# Patient Record
Sex: Female | Born: 2000 | Race: Black or African American | Hispanic: No | Marital: Single | Smoking: Never smoker
Health system: Southern US, Community
[De-identification: ages and names within clinical notes are randomized; demographics above are authoritative.]

## PROBLEM LIST (undated history)

## (undated) DIAGNOSIS — B009 Herpesviral infection, unspecified: Secondary | ICD-10-CM

## (undated) HISTORY — DX: Herpesviral infection, unspecified: B00.9

---
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
## 2019-01-11 ENCOUNTER — Other Ambulatory Visit: Payer: Self-pay

## 2019-01-11 ENCOUNTER — Ambulatory Visit: Payer: Medicaid Other | Attending: Internal Medicine

## 2019-01-11 DIAGNOSIS — Z20822 Contact with and (suspected) exposure to covid-19: Secondary | ICD-10-CM

## 2019-01-12 LAB — NOVEL CORONAVIRUS, NAA: SARS-CoV-2, NAA: DETECTED — AB

## 2019-01-13 ENCOUNTER — Telehealth: Payer: Self-pay | Admitting: Physician Assistant

## 2019-01-13 NOTE — Telephone Encounter (Signed)
Patient was contacted regarding positive covid 19 result. Pt's symptoms include asomnia and hypogeusia, sore throat. No shortness of breath. We went over CDC guidelines for quarantine and ER precautions.    Oria Klimas PA-C  [facility]   

## 2019-03-08 DIAGNOSIS — T23271A Burn of second degree of right wrist, initial encounter: Secondary | ICD-10-CM | POA: Diagnosis not present

## 2019-03-11 DIAGNOSIS — Z3202 Encounter for pregnancy test, result negative: Secondary | ICD-10-CM | POA: Diagnosis not present

## 2019-03-11 DIAGNOSIS — N760 Acute vaginitis: Secondary | ICD-10-CM | POA: Diagnosis not present

## 2019-05-27 DIAGNOSIS — T24132A Burn of first degree of left lower leg, initial encounter: Secondary | ICD-10-CM | POA: Diagnosis not present

## 2019-05-27 DIAGNOSIS — T24131A Burn of first degree of right lower leg, initial encounter: Secondary | ICD-10-CM | POA: Diagnosis not present

## 2019-08-18 DIAGNOSIS — N926 Irregular menstruation, unspecified: Secondary | ICD-10-CM | POA: Diagnosis not present

## 2019-08-25 ENCOUNTER — Ambulatory Visit: Payer: Medicaid Other | Admitting: Nurse Practitioner

## 2019-10-19 ENCOUNTER — Other Ambulatory Visit: Payer: Self-pay | Admitting: *Deleted

## 2019-10-19 DIAGNOSIS — Z3041 Encounter for surveillance of contraceptive pills: Secondary | ICD-10-CM

## 2019-10-20 ENCOUNTER — Other Ambulatory Visit: Payer: Self-pay | Admitting: *Deleted

## 2019-10-20 DIAGNOSIS — Z3041 Encounter for surveillance of contraceptive pills: Secondary | ICD-10-CM

## 2019-10-20 MED ORDER — NORETHIN ACE-ETH ESTRAD-FE 1-20 MG-MCG PO TABS: 1.0000 | ORAL_TABLET | Freq: Every day | ORAL | 0 refills | Status: DC

## 2019-10-21 ENCOUNTER — Other Ambulatory Visit: Payer: Self-pay | Admitting: Family Medicine

## 2019-10-21 DIAGNOSIS — Z3041 Encounter for surveillance of contraceptive pills: Secondary | ICD-10-CM

## 2019-10-23 MED ORDER — NORETHIN ACE-ETH ESTRAD-FE 1-20 MG-MCG PO TABS: 1.0000 | ORAL_TABLET | Freq: Every day | ORAL | 0 refills | Status: DC

## 2019-10-23 NOTE — Telephone Encounter (Signed)
E-prescribe down. resent 

## 2019-10-23 NOTE — Addendum Note (Signed)
Addended by: Julious Payer D on: 10/23/2019 10:40 AM   Modules accepted: Orders

## 2019-11-02 ENCOUNTER — Ambulatory Visit: Payer: Medicaid Other | Admitting: Nurse Practitioner

## 2019-11-20 ENCOUNTER — Ambulatory Visit (INDEPENDENT_AMBULATORY_CARE_PROVIDER_SITE_OTHER): Payer: Medicaid Other | Admitting: Nurse Practitioner

## 2019-11-20 ENCOUNTER — Encounter: Payer: Self-pay | Admitting: Nurse Practitioner

## 2019-11-20 DIAGNOSIS — Z3041 Encounter for surveillance of contraceptive pills: Secondary | ICD-10-CM | POA: Insufficient documentation

## 2019-11-20 MED ORDER — NORETHIN ACE-ETH ESTRAD-FE 1-20 MG-MCG PO TABS: 1.0000 | ORAL_TABLET | Freq: Every day | ORAL | 6 refills | Status: DC

## 2019-11-20 NOTE — Assessment & Plan Note (Signed)
LMP-11/18/2019-currently on menstrual cycle. No adverse effect on current medication.  Refill sent to pharmacy.

## 2019-11-20 NOTE — Progress Notes (Signed)
   Virtual Visit via telephone Note Due to COVID-19 pandemic this visit was conducted virtually. This visit type was conducted due to national recommendations for restrictions regarding the COVID-19 Pandemic (e.g. social distancing, sheltering in place) in an effort to limit this patient's exposure and mitigate transmission in our community. All issues noted in this document were discussed and addressed.  A physical exam was not performed with this format.  I connected with Heather Barajas on 11/20/19 at 02:40 pm by telephone and verified that I am speaking with the correct person using two identifiers. Heather Barajas is currently located in her car and a friend is currently with patient during visit. The provider, Daryll Drown, NP is located in their office at time of visit.  I discussed the limitations, risks, security and privacy concerns of performing an evaluation and management service by telephone and the availability of in person appointments. I also discussed with the patient that there may be a patient responsible charge related to this service. The patient expressed understanding and agreed to proceed.   History and Present Illness:  HPI  Patient's televisit encounter for contraceptive pills.  Patient is compliant with current medication, no adverse side effects, patient follows up as directed.  Last menstrual.  11/18/2019 and patient is currently on menstrual cycle.  Review of Systems  Constitutional: Negative for chills and malaise/fatigue.  Neurological: Negative for dizziness and headaches.  Psychiatric/Behavioral: The patient is not nervous/anxious.   All other systems reviewed and are negative.    Observations/Objective:  Tele Visit  Assessment and Plan:  Encounter for surveillance of contraceptive pills LMP-11/18/2019-currently on menstrual cycle. No adverse effect on current medication.  Refill sent to pharmacy.    Follow Up Instructions:  Follow-up in 6  months   I discussed the assessment and treatment plan with the patient. The patient was provided an opportunity to ask questions and all were answered. The patient agreed with the plan and demonstrated an understanding of the instructions.   The patient was advised to call back or seek an in-person evaluation if the symptoms worsen or if the condition fails to improve as anticipated.  The above assessment and management plan was discussed with the patient. The patient verbalized understanding of and has agreed to the management plan. Patient is aware to call the clinic if symptoms persist or worsen. Patient is aware when to return to the clinic for a follow-up visit. Patient educated on when it is appropriate to go to the emergency department.   Time call ended:  02:50 pm  I provided 10 minutes of non-face-to-face time during this encounter.    Daryll Drown, NP

## 2019-12-08 ENCOUNTER — Telehealth: Payer: Medicaid Other | Admitting: Nurse Practitioner

## 2019-12-08 DIAGNOSIS — J00 Acute nasopharyngitis [common cold]: Secondary | ICD-10-CM | POA: Diagnosis not present

## 2019-12-08 MED ORDER — FLUTICASONE PROPIONATE 50 MCG/ACT NA SUSP: 2.0000 | Freq: Every day | NASAL | 6 refills | Status: DC

## 2019-12-08 MED ORDER — BENZONATATE 100 MG PO CAPS: 100.0000 mg | ORAL_CAPSULE | Freq: Three times a day (TID) | ORAL | 0 refills | Status: DC | PRN

## 2019-12-08 NOTE — Progress Notes (Signed)

## 2020-02-07 DIAGNOSIS — Z20822 Contact with and (suspected) exposure to covid-19: Secondary | ICD-10-CM | POA: Diagnosis not present

## 2020-02-15 DIAGNOSIS — Z20822 Contact with and (suspected) exposure to covid-19: Secondary | ICD-10-CM | POA: Diagnosis not present

## 2020-02-15 DIAGNOSIS — Z03818 Encounter for observation for suspected exposure to other biological agents ruled out: Secondary | ICD-10-CM | POA: Diagnosis not present

## 2020-03-07 ENCOUNTER — Encounter: Payer: Self-pay | Admitting: Family Medicine

## 2020-03-07 ENCOUNTER — Other Ambulatory Visit: Payer: Self-pay

## 2020-03-07 ENCOUNTER — Other Ambulatory Visit (HOSPITAL_COMMUNITY)
Admission: RE | Admit: 2020-03-07 | Discharge: 2020-03-07 | Disposition: A | Discharge status: Home / Self Care | Payer: Medicaid Other | Source: Ambulatory Visit | Attending: Family Medicine | Admitting: Family Medicine

## 2020-03-07 ENCOUNTER — Ambulatory Visit (INDEPENDENT_AMBULATORY_CARE_PROVIDER_SITE_OTHER): Payer: Medicaid Other | Admitting: Family Medicine

## 2020-03-07 VITALS — BP 110/62 | HR 62 | Temp 97.8°F | Ht <= 58 in | Wt 140.6 lb

## 2020-03-07 DIAGNOSIS — N898 Other specified noninflammatory disorders of vagina: Secondary | ICD-10-CM | POA: Insufficient documentation

## 2020-03-07 LAB — WET PREP FOR TRICH, YEAST, CLUE
Clue Cell Exam: NEGATIVE
Trichomonas Exam: NEGATIVE
Yeast Exam: NEGATIVE

## 2020-03-07 NOTE — Progress Notes (Unsigned)
Subjective: CC: vaginal discharge PCP: Daryll Drown, NP Heather Barajas is a 20 y.o. female presenting to clinic today for:  1. Vaginal Discharge  Patient reports that discharge started a few days prior to her menstrual cycle, which is now on.  She notes that discharge appears yellow and has a body odor.  She thought it was thick and even cottage cheeselike prior to cycle.   She denies vaginal pruritis, abnormal vaginal bleeding, dysuria, hematuria, pelvic pain, nausea, vomiting, fevers, dyspareunia, post coital bleeding, sore throat, rashes.   She has used nothing.   She reports a history of chlamydia and bacterial vaginosis in the past.  She is sexually active and in a monogamous relationship.  Patient's last menstrual period was 03/05/2020.     ROS: Per HPI  No Known Allergies History reviewed. No pertinent past medical history.  Current Outpatient Medications:  .  fluticasone (FLONASE) 50 MCG/ACT nasal spray, Place 2 sprays into both nostrils daily., Disp: 16 g, Rfl: 6 .  norethindrone-ethinyl estradiol (JUNEL FE 1/20) 1-20 MG-MCG tablet, Take 1 tablet by mouth daily. (Needs to be seen before next refill), Disp: 28 tablet, Rfl: 6 .  Probiotic Product (PROBIOTIC DAILY) CAPS, Take 1 capsule by mouth daily., Disp: 90 capsule, Rfl: 1 Social History   Socioeconomic History  . Marital status: Single    Spouse name: Not on file  . Number of children: Not on file  . Years of education: Not on file  . Highest education level: Not on file  Occupational History  . Not on file  Tobacco Use  . Smoking status: Never Smoker  . Smokeless tobacco: Never Used  Vaping Use  . Vaping Use: Never used  Substance and Sexual Activity  . Alcohol use: No    Alcohol/week: 0.0 standard drinks  . Drug use: No  . Sexual activity: Not on file  Other Topics Concern  . Not on file  Social History Narrative  . Not on file   Social Determinants of Health   Financial Resource Strain: Not  on file  Food Insecurity: Not on file  Transportation Needs: Not on file  Physical Activity: Not on file  Stress: Not on file  Social Connections: Not on file  Intimate Partner Violence: Not on file   History reviewed. No pertinent family history.  Objective: Office vital signs reviewed. BP 110/62   Pulse 62   Temp 97.8 F (36.6 C) (Temporal)   Ht 4\' 10"  (1.473 m)   Wt 140 lb 9.6 oz (63.8 kg)   SpO2 100%   PF 62 L/min   BMI 29.39 kg/m   Physical Examination:  General: Awake, alert, well nourished, No acute distress GU: external vaginal tissue normal, cervix midline, no punctate lesions on cervix appreciated, moderate bloody discharge from cervical os, no cervical motion tenderness, no abdominal/ adnexal masses  Assessment/ Plan: 20 y.o. female   Vaginal discharge - Plan: GC/Chlamydia probe amp ([place])not at [facility], WET PREP FOR TRICH, YEAST, CLUE  Wet prep without evidence of bacterial vaginosis or yeast.  GC/ CT order placed.  I suspect that the changes she saw are likely premenstrual in nature but will await these other STD labs and contact her at [phone] once the rest the results are available  No orders of the defined types were placed in this encounter.  No orders of the defined types were placed in this encounter.    04-13-1975, DO [facility] [phone]

## 2020-03-07 NOTE — Patient Instructions (Signed)

## 2020-03-11 LAB — GC/CHLAMYDIA PROBE AMP (~~LOC~~) NOT AT ARMC
Chlamydia: NEGATIVE
Comment: NEGATIVE
Comment: NORMAL
Neisseria Gonorrhea: NEGATIVE

## 2020-05-21 ENCOUNTER — Other Ambulatory Visit: Payer: Self-pay

## 2020-05-21 ENCOUNTER — Ambulatory Visit (INDEPENDENT_AMBULATORY_CARE_PROVIDER_SITE_OTHER): Payer: Medicaid Other | Admitting: Nurse Practitioner

## 2020-05-21 ENCOUNTER — Encounter: Payer: Self-pay | Admitting: Nurse Practitioner

## 2020-05-21 VITALS — BP 119/74 | HR 72 | Temp 97.9°F | Ht <= 58 in | Wt 139.0 lb

## 2020-05-21 DIAGNOSIS — Z23 Encounter for immunization: Secondary | ICD-10-CM

## 2020-05-21 DIAGNOSIS — Z3041 Encounter for surveillance of contraceptive pills: Secondary | ICD-10-CM

## 2020-05-21 MED ORDER — NORETHIN ACE-ETH ESTRAD-FE 1-20 MG-MCG PO TABS: 1.0000 | ORAL_TABLET | Freq: Every day | ORAL | 6 refills | Status: DC

## 2020-05-21 NOTE — Assessment & Plan Note (Signed)
Refill oral birth control.  Education provided to patient with printed handouts given.  HPV third dose shot given.  Patient tolerated well  Hyperextended pharmacy Follow-up in 6 months.

## 2020-05-21 NOTE — Progress Notes (Signed)
Established Patient Office Visit  Subjective:  Patient ID: Heather Barajas, female    DOB: [birth year]  Age: 20 y.o. MRN: [id number]  CC:  Chief Complaint  Patient presents with  . Contraception    HPI Heather Barajas presents for female contraception.  No new concerns.  Patient compliant with current medication in is not experiencing any side effects.  Last menstrual period 05/20/2020.    Social History   Socioeconomic History  . Marital status: Single    Spouse name: Not on file  . Number of children: Not on file  . Years of education: Not on file  . Highest education level: Not on file  Occupational History  . Not on file  Tobacco Use  . Smoking status: Never Smoker  . Smokeless tobacco: Never Used  Vaping Use  . Vaping Use: Never used  Substance and Sexual Activity  . Alcohol use: No    Alcohol/week: 0.0 standard drinks  . Drug use: No  . Sexual activity: Yes    Birth control/protection: Pill  Other Topics Concern  . Not on file  Social History Narrative  . Not on file   Social Determinants of Health   Financial Resource Strain: Not on file  Food Insecurity: Not on file  Transportation Needs: Not on file  Physical Activity: Not on file  Stress: Not on file  Social Connections: Not on file  Intimate Partner Violence: Not on file    Outpatient Medications Prior to Visit  Medication Sig Dispense Refill  . fluticasone (FLONASE) 50 MCG/ACT nasal spray Place 2 sprays into both nostrils daily. 16 g 6  . norethindrone-ethinyl estradiol (JUNEL FE 1/20) 1-20 MG-MCG tablet Take 1 tablet by mouth daily. (Needs to be seen before next refill) 28 tablet 6   No facility-administered medications prior to visit.    No Known Allergies  ROS Review of Systems  Constitutional: Negative.   HENT: Negative.   Respiratory: Negative.   Cardiovascular: Negative.   Gastrointestinal: Negative.   Genitourinary: Negative.   Musculoskeletal: Negative.   Skin: Negative.   Negative for rash.  All other systems reviewed and are negative.     Objective:    Physical Exam Vitals reviewed.  Constitutional:      Appearance: Normal appearance.  HENT:     Head: Normocephalic.     Nose: Nose normal.  Eyes:     Conjunctiva/sclera: Conjunctivae normal.  Cardiovascular:     Rate and Rhythm: Normal rate and regular rhythm.     Pulses: Normal pulses.     Heart sounds: Normal heart sounds.  Pulmonary:     Effort: Pulmonary effort is normal.     Breath sounds: Normal breath sounds.  Abdominal:     General: Bowel sounds are normal.  Musculoskeletal:        General: Normal range of motion.  Skin:    Findings: No rash.  Neurological:     Mental Status: She is alert and oriented to person, place, and time.  Psychiatric:        Mood and Affect: Mood normal.        Behavior: Behavior normal.     BP 119/74   Pulse 72   Temp 97.9 F (36.6 C) (Temporal)   Ht 4\' 10"  (1.473 m)   Wt 139 lb (63 kg)   LMP 04/28/2020   SpO2 99%   BMI 29.05 kg/m  Wt Readings from Last 3 Encounters:  05/21/20 139 lb (63 kg)  03/07/20  140 lb 9.6 oz (63.8 kg)  07/28/18 131 lb (59.4 kg) (59 %, Z= 0.23)*   * Growth percentiles are based on CDC (Girls, 2-20 Years) data.     Health Maintenance Due  Topic Date Due  . COVID-19 Vaccine (3 - Booster for Pfizer series) 09/19/2019    There are no preventive care reminders to display for this patient.  No results found for: TSH Lab Results  Component Value Date   WBC 7.0 10/29/2016   HGB 11.8 10/29/2016   HCT 35.6 10/29/2016   MCV 90 10/29/2016   PLT 358 10/29/2016      Assessment & Plan:   Problem List Items Addressed This Visit      Other   Encounter for surveillance of contraceptive pills - Primary    Refill oral birth control.  Education provided to patient with printed handouts given.  HPV third dose shot given.  Patient tolerated well  Hyperextended pharmacy Follow-up in 6 months.      Relevant Medications    norethindrone-ethinyl estradiol-FE (JUNEL FE 1/20) 1-20 MG-MCG tablet      Meds ordered this encounter  Medications  . norethindrone-ethinyl estradiol-FE (JUNEL FE 1/20) 1-20 MG-MCG tablet    Sig: Take 1 tablet by mouth daily. (Needs to be seen before next refill)    Dispense:  28 tablet    Refill:  6    Order Specific Question:   Supervising Provider    Answer:   Raliegh Ip [[id number]]    Follow-up: Return in about 6 months (around 11/21/2020).    Daryll Drown, NP

## 2020-05-21 NOTE — Patient Instructions (Signed)

## 2020-08-08 ENCOUNTER — Telehealth: Payer: Medicaid Other | Admitting: Physician Assistant

## 2020-08-08 DIAGNOSIS — R350 Frequency of micturition: Secondary | ICD-10-CM

## 2020-08-08 DIAGNOSIS — N760 Acute vaginitis: Secondary | ICD-10-CM | POA: Diagnosis not present

## 2020-08-08 MED ORDER — FLUCONAZOLE 150 MG PO TABS: 150.0000 mg | ORAL_TABLET | Freq: Once | ORAL | 0 refills | Status: AC

## 2020-08-08 MED ORDER — CEPHALEXIN 500 MG PO CAPS: 500.0000 mg | ORAL_CAPSULE | Freq: Four times a day (QID) | ORAL | 0 refills | Status: AC

## 2020-08-08 NOTE — Progress Notes (Signed)
E-Visit for Vaginal Symptoms  We are sorry that you are not feeling well. Here is how we plan to help! Based on what you shared with me it looks like you: May have a yeast vaginosis  Vaginosis is an inflammation of the vagina that can result in discharge, itching and pain. The cause is usually a change in the normal balance of vaginal bacteria or an infection. Vaginosis can also result from reduced estrogen levels after menopause.  The most common causes of vaginosis are:   Bacterial vaginosis which results from an overgrowth of one on several organisms that are normally present in your vagina.   Yeast infections which are caused by a naturally occurring fungus called candida.   Vaginal atrophy (atrophic vaginosis) which results from the thinning of the vagina from reduced estrogen levels after menopause.   Trichomoniasis which is caused by a parasite and is commonly transmitted by sexual intercourse.  Factors that increase your risk of developing vaginosis include: Medications, such as antibiotics and steroids Uncontrolled diabetes Use of hygiene products such as bubble bath, vaginal spray or vaginal deodorant Douching Wearing damp or tight-fitting clothing Using an intrauterine device (IUD) for birth control Hormonal changes, such as those associated with pregnancy, birth control pills or menopause Sexual activity Having a sexually transmitted infection  Your treatment plan is A single Diflucan (fluconazole) 150mg  tablet once.  I have electronically sent this prescription into the pharmacy that you have chosen.  I have also prescribed Keflex to treat a potentially urinary tract infection.  Please note that it is not normal to have painful intercourse with a urinary trace infection or a yeast infection. If your symptoms persist despite taking the prescriptions I am giving you, then you need to be seen in person for a pelvic exam and STD testing.   Be sure to take all of the  medication as directed. Stop taking any medication if you develop a rash, tongue swelling or shortness of breath. Mothers who are breast feeding should consider pumping and discarding their breast milk while on these antibiotics. However, there is no consensus that infant exposure at these doses would be harmful.  Remember that medication creams can weaken latex condoms.   HOME CARE:  Good hygiene may prevent some types of vaginosis from recurring and may relieve some symptoms:  Avoid baths, hot tubs and whirlpool spas. Rinse soap from your outer genital area after a shower, and dry the area well to prevent irritation. Don't use scented or harsh soaps, such as those with deodorant or antibacterial action. Avoid irritants. These include scented tampons and pads. Wipe from front to back after using the toilet. Doing so avoids spreading fecal bacteria to your vagina.  Other things that may help prevent vaginosis include:  Don't douche. Your vagina doesn't require cleansing other than normal bathing. Repetitive douching disrupts the normal organisms that reside in the vagina and can actually increase your risk of vaginal infection. Douching won't clear up a vaginal infection. Use a latex condom. Both female and female latex condoms may help you avoid infections spread by sexual contact. Wear cotton underwear. Also wear pantyhose with a cotton crotch. If you feel comfortable without it, skip wearing underwear to bed. Yeast thrives in [place] Your symptoms should improve in the next day or two.  GET HELP RIGHT AWAY IF:  You have pain in your lower abdomen ( pelvic area or over your ovaries) You develop nausea or vomiting You develop a fever Your discharge  changes or worsens You have persistent pain with intercourse You develop shortness of breath, a rapid pulse, or you faint.  These symptoms could be signs of problems or infections that need to be evaluated by a medical provider  now.  MAKE SURE YOU   Understand these instructions. Will watch your condition. Will get help right away if you are not doing well or get worse.  Thank you for choosing an e-visit.  Your e-visit answers were reviewed by a board certified advanced clinical practitioner to complete your personal care plan. Depending upon the condition, your plan could have included both over the counter or prescription medications.  Please review your pharmacy choice. Make sure the pharmacy is open so you can pick up prescription now. If there is a problem, you may contact your provider through [employer] and have the prescription routed to another pharmacy.  Your safety is important to [place]. If you have drug allergies check your prescription carefully.   For the next 24 hours you can use MyChart to ask questions about today's visit, request a non-urgent call back, or ask for a work or school excuse. You will get an email in the next two days asking about your experience. I hope that your e-visit has been valuable and will speed your recovery.  Approximately 5 minutes was spent documenting and reviewing patient's chart.

## 2020-11-27 ENCOUNTER — Other Ambulatory Visit: Payer: Self-pay | Admitting: Nurse Practitioner

## 2020-11-27 DIAGNOSIS — Z3041 Encounter for surveillance of contraceptive pills: Secondary | ICD-10-CM

## 2020-12-01 DIAGNOSIS — Z20822 Contact with and (suspected) exposure to covid-19: Secondary | ICD-10-CM | POA: Diagnosis not present

## 2020-12-20 DIAGNOSIS — N841 Polyp of cervix uteri: Secondary | ICD-10-CM | POA: Diagnosis not present

## 2020-12-20 DIAGNOSIS — N898 Other specified noninflammatory disorders of vagina: Secondary | ICD-10-CM | POA: Diagnosis not present

## 2020-12-20 DIAGNOSIS — N760 Acute vaginitis: Secondary | ICD-10-CM | POA: Diagnosis not present

## 2020-12-20 DIAGNOSIS — N912 Amenorrhea, unspecified: Secondary | ICD-10-CM | POA: Diagnosis not present

## 2020-12-20 DIAGNOSIS — Z3202 Encounter for pregnancy test, result negative: Secondary | ICD-10-CM | POA: Diagnosis not present

## 2021-06-15 ENCOUNTER — Other Ambulatory Visit: Payer: Self-pay | Admitting: Nurse Practitioner

## 2021-06-15 DIAGNOSIS — Z3041 Encounter for surveillance of contraceptive pills: Secondary | ICD-10-CM

## 2021-06-17 ENCOUNTER — Telehealth (INDEPENDENT_AMBULATORY_CARE_PROVIDER_SITE_OTHER): Payer: Medicaid Other | Admitting: Nurse Practitioner

## 2021-06-17 ENCOUNTER — Encounter: Payer: Self-pay | Admitting: Nurse Practitioner

## 2021-06-17 DIAGNOSIS — Z3041 Encounter for surveillance of contraceptive pills: Secondary | ICD-10-CM

## 2021-06-17 NOTE — Progress Notes (Signed)
   Virtual Visit  Note Due to COVID-19 pandemic this visit was conducted virtually. This visit type was conducted due to national recommendations for restrictions regarding the COVID-19 Pandemic (e.g. social distancing, sheltering in place) in an effort to limit this patient's exposure and mitigate transmission in our community. All issues noted in this document were discussed and addressed.  A physical exam was not performed with this format.  I connected with Heather Barajas on 06/17/21 at 12;40 pm by telephone and verified that I am speaking with the correct person using two identifiers. Heather Barajas is currently located at work  during visit. The provider, Daryll Drown, NP is located in their office at time of visit.  I discussed the limitations, risks, security and privacy concerns of performing an evaluation and management service by telephone and the availability of in person appointments. I also discussed with the patient that there may be a patient responsible charge related to this service. The patient expressed understanding and agreed to proceed.   History and Present Illness:   Patient is being seen today for refill contraceptive pill.  Last menstrual period: Currently on her period, no new concerns, patient has no adverse reaction or side effect from medication.  She is compliant with follow-up as directed   Review of Systems  Constitutional: Negative.   HENT: Negative.    Eyes: Negative.   Respiratory: Negative.    Cardiovascular: Negative.   Genitourinary: Negative.   Musculoskeletal: Negative.   Skin: Negative.   All other systems reviewed and are negative.    Observations/Objective: Televisit patient not in distress  Assessment and Plan: Completed urine pregnancy test results pending.  Will refill birth control pills with negative pregnancy test.  Follow Up Instructions: Follow-up as needed    I discussed the assessment and treatment plan with the  patient. The patient was provided an opportunity to ask questions and all were answered. The patient agreed with the plan and demonstrated an understanding of the instructions.   The patient was advised to call back or seek an in-person evaluation if the symptoms worsen or if the condition fails to improve as anticipated.  The above assessment and management plan was discussed with the patient. The patient verbalized understanding of and has agreed to the management plan. Patient is aware to call the clinic if symptoms persist or worsen. Patient is aware when to return to the clinic for a follow-up visit. Patient educated on when it is appropriate to go to the emergency department.   Time call ended: 12:50 PM  I provided 10 minutes of  non face-to-face time during this encounter.    Daryll Drown, NP

## 2021-06-18 ENCOUNTER — Other Ambulatory Visit: Payer: Medicaid Other

## 2021-06-18 ENCOUNTER — Other Ambulatory Visit: Payer: Self-pay

## 2021-06-18 DIAGNOSIS — Z3041 Encounter for surveillance of contraceptive pills: Secondary | ICD-10-CM

## 2021-06-18 LAB — PREGNANCY, URINE: Preg Test, Ur: NEGATIVE

## 2021-06-19 ENCOUNTER — Other Ambulatory Visit: Payer: Self-pay | Admitting: Nurse Practitioner

## 2021-06-19 DIAGNOSIS — Z3041 Encounter for surveillance of contraceptive pills: Secondary | ICD-10-CM

## 2021-06-20 ENCOUNTER — Telehealth: Payer: Self-pay | Admitting: Nurse Practitioner

## 2021-06-20 DIAGNOSIS — Z3041 Encounter for surveillance of contraceptive pills: Secondary | ICD-10-CM

## 2021-06-20 MED ORDER — NORETHIN ACE-ETH ESTRAD-FE 1-20 MG-MCG PO TABS: 1.0000 | ORAL_TABLET | Freq: Every day | ORAL | 6 refills | Status: DC

## 2021-06-20 NOTE — Telephone Encounter (Signed)
Prescription sent to pharmacy.   Patient had video visit on 06/18/21

## 2021-09-18 ENCOUNTER — Encounter: Payer: Self-pay | Admitting: Family

## 2021-09-18 ENCOUNTER — Telehealth: Payer: Medicaid Other | Admitting: Family

## 2021-09-18 DIAGNOSIS — Z20828 Contact with and (suspected) exposure to other viral communicable diseases: Secondary | ICD-10-CM

## 2021-09-18 DIAGNOSIS — R6889 Other general symptoms and signs: Secondary | ICD-10-CM | POA: Diagnosis not present

## 2021-09-18 MED ORDER — OSELTAMIVIR PHOSPHATE 75 MG PO CAPS: 75.0000 mg | ORAL_CAPSULE | Freq: Two times a day (BID) | ORAL | 0 refills | Status: DC

## 2021-09-18 NOTE — Progress Notes (Signed)
Virtual Visit Consent   Heather Barajas, you are scheduled for a virtual visit with a [facility] provider today. Just as with appointments in the office, your consent must be obtained to participate. Your consent will be active for this visit and any virtual visit you may have with one of our providers in the next 365 days. If you have a MyChart account, a copy of this consent can be sent to you electronically.  As this is a virtual visit, video technology does not allow for your provider to perform a traditional examination. This may limit your provider's ability to fully assess your condition. If your provider identifies any concerns that need to be evaluated in person or the need to arrange testing (such as labs, EKG, etc.), we will make arrangements to do so. Although advances in technology are sophisticated, we cannot ensure that it will always work on either your end or our end. If the connection with a video visit is poor, the visit may have to be switched to a telephone visit. With either a video or telephone visit, we are not always able to ensure that we have a secure connection.  By engaging in this virtual visit, you consent to the provision of healthcare and authorize for your insurance to be billed (if applicable) for the services provided during this visit. Depending on your insurance coverage, you may receive a charge related to this service.  I need to obtain your verbal consent now. Are you willing to proceed with your visit today? [name] has provided verbal consent on 09/18/2021 for a virtual visit (video or telephone). [name], FNP  Date: 09/18/2021 12:47 PM  Virtual Visit via Video Note   I, [name], connected with  Heather Barajas  ([id number], [birth year]) on 09/18/21 at  4:30 PM EDT by a video-enabled telemedicine application and verified that I am speaking with the correct person using two identifiers.  Location: Patient: Virtual Visit Location Patient:  Home Provider: Virtual Visit Location Provider: Office/Clinic   I discussed the limitations of evaluation and management by telemedicine and the availability of in person appointments. The patient expressed understanding and agreed to proceed.    History of Present Illness: Heather Barajas is a 21 y.o. who identifies as a female who was assigned female at birth, and is being seen today for flu like symptoms that started yesterday. She reports congestion, body aches,  headache, and fever. Reports she was exposed to the Flu two days ago.   HPI: Influenza This is a new problem. The current episode started yesterday. The problem has been unchanged. Associated symptoms include chills, congestion, coughing, fatigue, a fever, headaches and myalgias. Pertinent negatives include no vomiting or weakness. She has tried NSAIDs and acetaminophen for the symptoms. The treatment provided mild relief.    Problems:  Patient Active Problem List   Diagnosis Date Noted   Encounter for surveillance of contraceptive pills 11/20/2019    Allergies: No Known Allergies Medications:  Current Outpatient Medications:    oseltamivir (TAMIFLU) 75 MG capsule, Take 1 capsule (75 mg total) by mouth 2 (two) times daily., Disp: 10 capsule, Rfl: 0   norethindrone-ethinyl estradiol-FE (JUNEL FE 1/20) 1-20 MG-MCG tablet, Take 1 tablet by mouth daily., Disp: 28 tablet, Rfl: 6  Observations/Objective: Patient is well-developed, well-nourished in no acute distress.  Resting comfortably  at home.  Head is normocephalic, atraumatic.  No labored breathing.  Speech is clear and coherent with logical content.  Patient is  alert and oriented at baseline.  Nasal congestion  Assessment and Plan: 1. Exposure to the flu - oseltamivir (TAMIFLU) 75 MG capsule; Take 1 capsule (75 mg total) by mouth 2 (two) times daily.  Dispense: 10 capsule; Refill: 0  2. Flu-like symptoms - oseltamivir (TAMIFLU) 75 MG capsule; Take 1 capsule (75 mg  total) by mouth 2 (two) times daily.  Dispense: 10 capsule; Refill: 0  Force fluids Tylenol as needed Droplet precautions  Follow up if symptoms worsen or do not improve   Follow Up Instructions: I discussed the assessment and treatment plan with the patient. The patient was provided an opportunity to ask questions and all were answered. The patient agreed with the plan and demonstrated an understanding of the instructions.  A copy of instructions were sent to the patient via MyChart unless otherwise noted below.    The patient was advised to call back or seek an in-person evaluation if the symptoms worsen or if the condition fails to improve as anticipated.  Time:  I spent 8 minutes with the patient via telehealth technology discussing the above problems/concerns.    [name], FNP

## 2021-10-27 DIAGNOSIS — N898 Other specified noninflammatory disorders of vagina: Secondary | ICD-10-CM | POA: Diagnosis not present

## 2021-10-27 DIAGNOSIS — B3731 Acute candidiasis of vulva and vagina: Secondary | ICD-10-CM | POA: Diagnosis not present

## 2021-12-05 ENCOUNTER — Other Ambulatory Visit: Payer: Self-pay | Admitting: Nurse Practitioner

## 2021-12-05 DIAGNOSIS — Z3041 Encounter for surveillance of contraceptive pills: Secondary | ICD-10-CM

## 2021-12-11 DIAGNOSIS — R0981 Nasal congestion: Secondary | ICD-10-CM | POA: Diagnosis not present

## 2021-12-11 DIAGNOSIS — J029 Acute pharyngitis, unspecified: Secondary | ICD-10-CM | POA: Diagnosis not present

## 2022-03-03 ENCOUNTER — Encounter: Payer: No Typology Code available for payment source | Admitting: Family Medicine

## 2022-03-03 ENCOUNTER — Ambulatory Visit (INDEPENDENT_AMBULATORY_CARE_PROVIDER_SITE_OTHER): Payer: No Typology Code available for payment source | Admitting: Family Medicine

## 2022-03-03 ENCOUNTER — Other Ambulatory Visit (HOSPITAL_COMMUNITY)
Admission: RE | Admit: 2022-03-03 | Discharge: 2022-03-03 | Disposition: A | Discharge status: Home / Self Care | Payer: Medicaid Other | Source: Ambulatory Visit | Attending: Family Medicine | Admitting: Family Medicine

## 2022-03-03 ENCOUNTER — Encounter: Payer: Self-pay | Admitting: Family Medicine

## 2022-03-03 VITALS — BP 114/72 | HR 79 | Temp 99.0°F | Ht <= 58 in | Wt 118.0 lb

## 2022-03-03 DIAGNOSIS — Z7251 High risk heterosexual behavior: Secondary | ICD-10-CM

## 2022-03-03 DIAGNOSIS — Z3041 Encounter for surveillance of contraceptive pills: Secondary | ICD-10-CM

## 2022-03-03 DIAGNOSIS — Z136 Encounter for screening for cardiovascular disorders: Secondary | ICD-10-CM

## 2022-03-03 DIAGNOSIS — Z124 Encounter for screening for malignant neoplasm of cervix: Secondary | ICD-10-CM | POA: Diagnosis not present

## 2022-03-03 DIAGNOSIS — Z0000 Encounter for general adult medical examination without abnormal findings: Secondary | ICD-10-CM

## 2022-03-03 DIAGNOSIS — Z23 Encounter for immunization: Secondary | ICD-10-CM

## 2022-03-03 DIAGNOSIS — Z793 Long term (current) use of hormonal contraceptives: Secondary | ICD-10-CM

## 2022-03-03 LAB — WET PREP FOR TRICH, YEAST, CLUE
Clue Cell Exam: NEGATIVE
Trichomonas Exam: NEGATIVE
Yeast Exam: NEGATIVE

## 2022-03-03 MED ORDER — NORETHIN ACE-ETH ESTRAD-FE 1-20 MG-MCG PO TABS: 1.0000 | ORAL_TABLET | Freq: Every day | ORAL | 3 refills | Status: DC

## 2022-03-03 NOTE — Addendum Note (Signed)
Addended by: Geryl Rankins D on: 03/03/2022 09:52 AM   Modules accepted: Orders

## 2022-03-03 NOTE — Progress Notes (Signed)
Complete physical exam  Patient: Heather Barajas   DOB: 2000/11/02   22 y.o. Female  MRN: [id number]   Heather Barajas is a 22 y.o. female presents to office today for annual physical exam examination.    Concerns today include: 1. None. States there has been a little discharge that is white but is normal and she is not concerned about it. Not like other discharge she has had in the past. Currently sexually active with one female partner.   Occupation: Education officer, museum, Marital status: single, Substance use: no, etoh socially  Diet: meat, avoids dairy, veggies and fruit, Exercise: not regularly  Last eye exam: 1.5 years ago  Last dental exam: [facility] in [place], sees regularly  Last colonoscopy: n/a  Last mammogram: n/a  Last pap smear: today!  Refills needed today: Junel BCP  Immunizations needed: TDAP  Immunization History  Administered Date(s) Administered   DTaP 12/23/1999, 02/02/2000, 03/24/2000, 01/25/2001, 10/22/2003   HPV 9-valent 06/23/2017, 07/21/2017, 05/21/2020   Hepatitis A, Ped/Adol-2 Dose 06/23/2017   Hepatitis B 07-23-1999, 10/30/1999, 03/24/2000, 10/22/2003   IPV 12/23/1999, 02/02/2000, 05/27/2000, 10/22/2003   Influenza,inj,Quad PF,6+ Mos 11/28/2014, 01/03/2016   MMR 01/25/2001, 10/22/2003   Meningococcal B, OMV 06/23/2017, 07/21/2017   Meningococcal Conjugate 06/23/2017   PFIZER(Purple Top)SARS-COV-2 Vaccination 03/29/2019, 04/19/2019   Tdap 07/30/2010   Varicella 01/25/2001     History reviewed. No pertinent past medical history. Social History   Socioeconomic History   Marital status: Single    Spouse name: Not on file   Number of children: Not on file   Years of education: Not on file   Highest education level: Not on file  Occupational History   Not on file  Tobacco Use   Smoking status: Never   Smokeless tobacco: Never  Vaping Use   Vaping Use: Never used  Substance and Sexual Activity   Alcohol use: No    Alcohol/week: 0.0 standard  drinks of alcohol   Drug use: No   Sexual activity: Yes    Birth control/protection: Pill  Other Topics Concern   Not on file  Social History Narrative   Not on file   Social Determinants of Health   Financial Resource Strain: Not on file  Food Insecurity: Not on file  Transportation Needs: Not on file  Physical Activity: Not on file  Stress: Not on file  Social Connections: Not on file  Intimate Partner Violence: Not on file   History reviewed. No pertinent surgical history. History reviewed. No pertinent family history.  Current Outpatient Medications:    norethindrone-ethinyl estradiol-FE (JUNEL FE 1/20) 1-20 MG-MCG tablet, TAKE 1 TABLET BY MOUTH EVERY DAY, Disp: 84 tablet, Rfl: 0  No Known Allergies   ROS: Review of Systems Pertinent items are noted in HPI.   All systems negative   Physical exam BP 114/72   Pulse 79   Temp 99 F (37.2 C)   Ht '4\' 10"'$  (1.473 m)   Wt 118 lb (53.5 kg)   LMP 02/10/2022 (Approximate)   SpO2 98%   BMI 24.66 kg/m   Physical Exam Exam conducted with a chaperone present.  Constitutional:      General: She is not in acute distress.    Appearance: Normal appearance. She is normal weight. She is not ill-appearing, toxic-appearing or diaphoretic.  HENT:     Head: Atraumatic.     Right Ear: Tympanic membrane, ear canal and external ear normal.     Left Ear: Tympanic membrane, ear canal  and external ear normal.     Nose: Nose normal. No congestion or rhinorrhea.     Mouth/Throat:     Mouth: Mucous membranes are moist.     Pharynx: Oropharynx is clear. No oropharyngeal exudate or posterior oropharyngeal erythema.  Eyes:     Extraocular Movements: Extraocular movements intact.     Conjunctiva/sclera: Conjunctivae normal.     Pupils: Pupils are equal, round, and reactive to light.  Cardiovascular:     Rate and Rhythm: Tachycardia present.     Heart sounds: No murmur heard.    No gallop.  Pulmonary:     Effort: No respiratory  distress.     Breath sounds: Normal breath sounds. No wheezing or rales.  Abdominal:     General: Abdomen is flat. Bowel sounds are normal.     Palpations: Abdomen is soft.  Genitourinary:    General: Normal vulva.     Exam position: Lithotomy position.     Pubic Area: No rash.      Tanner stage (genital): 5.     Labia:        Right: No rash, tenderness, lesion or injury.        Left: No rash, tenderness, lesion or injury.      Urethra: No prolapse, urethral pain, urethral swelling or urethral lesion.     Vagina: Vaginal discharge present.     Rectum: Normal.  Musculoskeletal:        General: No swelling, tenderness, deformity or signs of injury.     Cervical back: Normal range of motion.     Right lower leg: No edema.     Left lower leg: No edema.  Skin:    General: Skin is warm.     Capillary Refill: Capillary refill takes less than 2 seconds.     Coloration: Skin is not pale.     Findings: No bruising, erythema, lesion or rash.  Neurological:     General: No focal deficit present.     Mental Status: She is alert. Mental status is at baseline. She is disoriented.     Cranial Nerves: No cranial nerve deficit.     Sensory: No sensory deficit.     Motor: No weakness.     Coordination: Coordination normal.     Gait: Gait normal.     Deep Tendon Reflexes: Reflexes normal.  Psychiatric:        Mood and Affect: Mood normal.        Behavior: Behavior normal.        Thought Content: Thought content normal.        Judgment: Judgment normal.     Assessment/ Plan: Heather Barajas here for annual physical exam.  1. Encounter for general adult medical examination without abnormal findings Labs as below. Will communicate results to patient once available.  - CBC with Differential/Platelet - CMP14+EGFR  2. Encounter for screening for cardiovascular disorders Labs as below. Will communicate results to patient once available.  - Lipid panel  3. High risk heterosexual  behavior Labs as below. Will communicate results to patient once available.  - HepB+HepC+HIV Panel - RPR - POCT Wet Prep [facility])  4. Cervical cancer screening Labs as below. Will communicate results to patient once available. Educated pt on safe sexual practices.  - Cytology - PAP  5. Encounter for surveillance of contraceptive pills Refill as below. Educated pt on safe sexual practices. Pt denies any missed doses.  - norethindrone-ethinyl estradiol-FE (JUNEL FE 1/20) 1-20  MG-MCG tablet; Take 1 tablet by mouth daily.  Dispense: 84 tablet; Refill: 3  6. Long term (current) use of hormonal contraceptives Labs as below. Will communicate results to patient once available.  - TSH  Discussed with patient to continue healthy lifestyle choices, including diet (rich in fruits, vegetables, and lean proteins, and low in salt and simple carbohydrates) and exercise (at least 30 minutes of moderate physical activity daily). Limit beverages high is sugar. Recommended at least 80-100 oz of water daily.   The above assessment and management plan was discussed with the patient. The patient verbalized understanding of and has agreed to the management plan using shared-decision making. Patient is aware to call the clinic if they develop any new symptoms or if symptoms fail to improve or worsen. Patient is aware when to return to the clinic for a follow-up visit. Patient educated on when it is appropriate to go to the emergency department.   Donzetta Kohut, DNP-FNP [facility] [address] [place], [place] [address] [phone]

## 2022-03-04 LAB — LIPID PANEL
Chol/HDL Ratio: 2.8 ratio (ref 0.0–4.4)
Cholesterol, Total: 142 mg/dL (ref 100–199)
HDL: 51 mg/dL (ref >39)
LDL Chol Calc (NIH): 78 mg/dL (ref 0–99)
Triglycerides: 66 mg/dL (ref 0–149)
VLDL Cholesterol Cal: 13 mg/dL (ref 5–40)

## 2022-03-04 LAB — CBC WITH DIFFERENTIAL/PLATELET
Basophils Absolute: 0.1 10*3/uL (ref 0.0–0.2)
Basos: 1 %
EOS (ABSOLUTE): 0.2 10*3/uL (ref 0.0–0.4)
Eos: 4 %
Hematocrit: 38.3 % (ref 34.0–46.6)
Hemoglobin: 12.6 g/dL (ref 11.1–15.9)
Immature Grans (Abs): 0 10*3/uL (ref 0.0–0.1)
Immature Granulocytes: 0 %
Lymphocytes Absolute: 1.9 10*3/uL (ref 0.7–3.1)
Lymphs: 36 %
MCH: 29.4 pg (ref 26.6–33.0)
MCHC: 32.9 g/dL (ref 31.5–35.7)
MCV: 89 fL (ref 79–97)
Monocytes Absolute: 0.3 10*3/uL (ref 0.1–0.9)
Monocytes: 6 %
Neutrophils Absolute: 2.8 10*3/uL (ref 1.4–7.0)
Neutrophils: 53 %
Platelets: 338 10*3/uL (ref 150–450)
RBC: 4.29 x10E6/uL (ref 3.77–5.28)
RDW: 12.3 % (ref 11.7–15.4)
WBC: 5.4 10*3/uL (ref 3.4–10.8)

## 2022-03-04 LAB — CMP14+EGFR
ALT: 14 IU/L (ref 0–32)
AST: 13 IU/L (ref 0–40)
Albumin/Globulin Ratio: 2.1 (ref 1.2–2.2)
Albumin: 4.4 g/dL (ref 4.0–5.0)
Alkaline Phosphatase: 32 IU/L — ABNORMAL LOW (ref 44–121)
BUN/Creatinine Ratio: 16 (ref 9–23)
BUN: 12 mg/dL (ref 6–20)
Bilirubin Total: 0.3 mg/dL (ref 0.0–1.2)
CO2: 19 mmol/L — ABNORMAL LOW (ref 20–29)
Calcium: 9.3 mg/dL (ref 8.7–10.2)
Chloride: 108 mmol/L — ABNORMAL HIGH (ref 96–106)
Creatinine, Ser: 0.76 mg/dL (ref 0.57–1.00)
Globulin, Total: 2.1 g/dL (ref 1.5–4.5)
Glucose: 84 mg/dL (ref 70–99)
Potassium: 4.3 mmol/L (ref 3.5–5.2)
Sodium: 143 mmol/L (ref 134–144)
Total Protein: 6.5 g/dL (ref 6.0–8.5)
eGFR: 114 mL/min/{1.73_m2} (ref >59)

## 2022-03-04 LAB — HEPB+HEPC+HIV PANEL
HIV Screen 4th Generation wRfx: NONREACTIVE
Hep B C IgM: NEGATIVE
Hep B Core Total Ab: NEGATIVE
Hep B E Ab: NEGATIVE
Hep B E Ag: NEGATIVE
Hep B Surface Ab, Qual: REACTIVE
Hep C Virus Ab: NONREACTIVE
Hepatitis B Surface Ag: NEGATIVE

## 2022-03-04 LAB — RPR: RPR Ser Ql: NONREACTIVE

## 2022-03-04 LAB — TSH: TSH: 1.14 u[IU]/mL (ref 0.450–4.500)

## 2022-03-09 LAB — CYTOLOGY - PAP
Chlamydia: NEGATIVE
Comment: NEGATIVE
Comment: NEGATIVE
Comment: NEGATIVE
Comment: NORMAL
Diagnosis: NEGATIVE
HSV1: NEGATIVE
HSV2: NEGATIVE
Neisseria Gonorrhea: NEGATIVE
Trichomonas: NEGATIVE

## 2022-06-17 ENCOUNTER — Ambulatory Visit (INDEPENDENT_AMBULATORY_CARE_PROVIDER_SITE_OTHER): Payer: No Typology Code available for payment source | Admitting: Family Medicine

## 2022-06-17 ENCOUNTER — Encounter: Payer: Self-pay | Admitting: Family Medicine

## 2022-06-17 DIAGNOSIS — N898 Other specified noninflammatory disorders of vagina: Secondary | ICD-10-CM | POA: Diagnosis not present

## 2022-06-17 LAB — URINALYSIS, ROUTINE W REFLEX MICROSCOPIC
Bilirubin, UA: NEGATIVE
Glucose, UA: NEGATIVE
Ketones, UA: NEGATIVE
Leukocytes,UA: NEGATIVE
Nitrite, UA: NEGATIVE
Protein,UA: NEGATIVE
Specific Gravity, UA: 1.025 (ref 1.005–1.030)
Urobilinogen, Ur: 0.2 mg/dL (ref 0.2–1.0)
pH, UA: 6 (ref 5.0–7.5)

## 2022-06-17 LAB — MICROSCOPIC EXAMINATION
Bacteria, UA: NONE SEEN
Renal Epithel, UA: NONE SEEN /hpf

## 2022-06-17 LAB — WET PREP FOR TRICH, YEAST, CLUE
Clue Cell Exam: NEGATIVE
Trichomonas Exam: NEGATIVE
Yeast Exam: NEGATIVE

## 2022-06-17 NOTE — Progress Notes (Signed)
Acute Office Visit  Subjective:  Patient ID: Heather Barajas, female    DOB: May 19, 2000, 22 y.o.   MRN: [id number]  Chief Complaint  Patient presents with   Vaginal Discharge    Vaginal Discharge The patient's primary symptoms include vaginal discharge.   Patient is in today for vaginal discharge that started 1 week ago. States that she put a liner down to measure it and then really noticed it. No changes to sexual partner. No odor. Endorses irritation/itching. States that the irritations is intermittent throughout the day not with urination. Denies increase frequency of urination. Denies lower back pack and fever. States that she has had similar in the past and it was a yeast infection.   Review of Systems  Genitourinary:  Positive for vaginal discharge.   As per hpi   Objective:  BP 110/70   Pulse 67   Temp 98.3 F (36.8 C)   Ht 4\' 11"  (1.499 m)   Wt 117 lb (53.1 kg)   LMP 06/17/2022 (Exact Date)   SpO2 97%   BMI 23.63 kg/m    Physical Exam Constitutional:      General: She is awake. She is not in acute distress.    Appearance: Normal appearance. She is well-developed and well-groomed. She is not ill-appearing, toxic-appearing or diaphoretic.  Cardiovascular:     Rate and Rhythm: Normal rate.     Pulses: Normal pulses.          Radial pulses are 2+ on the right side and 2+ on the left side.       Posterior tibial pulses are 2+ on the right side and 2+ on the left side.     Heart sounds: Normal heart sounds. No murmur heard.    No gallop.  Pulmonary:     Effort: Pulmonary effort is normal. No respiratory distress.     Breath sounds: Normal breath sounds. No stridor. No wheezing, rhonchi or rales.  Abdominal:     General: Abdomen is flat. Bowel sounds are normal.     Tenderness: There is no right CVA tenderness or left CVA tenderness.  Musculoskeletal:     Cervical back: Full passive range of motion without pain and neck supple.     Right lower leg: No edema.      Left lower leg: No edema.  Skin:    General: Skin is warm.     Capillary Refill: Capillary refill takes less than 2 seconds.  Neurological:     General: No focal deficit present.     Mental Status: She is alert, oriented to person, place, and time and easily aroused. Mental status is at baseline.     GCS: GCS eye subscore is 4. GCS verbal subscore is 5. GCS motor subscore is 6.     Motor: No weakness.  Psychiatric:        Attention and Perception: Attention and perception normal.        Mood and Affect: Mood and affect normal.        Speech: Speech normal.        Behavior: Behavior normal. Behavior is cooperative.        Thought Content: Thought content normal. Thought content does not include homicidal or suicidal ideation. Thought content does not include homicidal or suicidal plan.        Cognition and Memory: Cognition and memory normal.        Judgment: Judgment normal.    Assessment & Plan:  1. Vaginal discharge  Labs as below. Will communicate results to patient once available.  Reviewed UA in office negative for infection. Will await results of remaining tests before treating. Discussed with patient to return if symptoms continue.  - Urinalysis, Routine w reflex microscopic - Urine Culture - WET PREP FOR TRICH, YEAST, CLUE - Ct Ng M genitalium NAA, Urine   The above assessment and management plan was discussed with the patient. The patient verbalized understanding of and has agreed to the management plan using shared-decision making. Patient is aware to call the clinic if they develop any new symptoms or if symptoms fail to improve or worsen. Patient is aware when to return to the clinic for a follow-up visit. Patient educated on when it is appropriate to go to the emergency department.   Return if symptoms worsen or fail to improve.  Neale Burly, DNP-FNP [facility] [address] [place], [place] [address] [phone]

## 2022-06-18 LAB — URINE CULTURE

## 2022-06-19 ENCOUNTER — Telehealth: Payer: Self-pay | Admitting: Family Medicine

## 2022-06-19 MED ORDER — AMOXICILLIN 875 MG PO TABS: 875.0000 mg | ORAL_TABLET | Freq: Two times a day (BID) | ORAL | 0 refills | Status: AC

## 2022-06-19 NOTE — Addendum Note (Signed)
Addended by: Neale Burly on: 06/19/2022 04:47 PM   Modules accepted: Orders

## 2022-06-19 NOTE — Progress Notes (Signed)
Yes, given symptoms and positive culture will treat with amoxicillin twice daily for 5 days.

## 2022-06-19 NOTE — Telephone Encounter (Signed)
Patient aware.

## 2022-06-20 LAB — CT NG M GENITALIUM NAA, URINE
Chlamydia trachomatis, NAA: NEGATIVE
Mycoplasma genitalium NAA: NEGATIVE
Neisseria gonorrhoeae, NAA: NEGATIVE

## 2022-07-13 ENCOUNTER — Ambulatory Visit (INDEPENDENT_AMBULATORY_CARE_PROVIDER_SITE_OTHER): Payer: Managed Care, Other (non HMO) | Admitting: Nurse Practitioner

## 2022-07-13 ENCOUNTER — Encounter: Payer: Self-pay | Admitting: Nurse Practitioner

## 2022-07-13 VITALS — BP 100/67 | HR 86 | Temp 97.5°F | Ht 59.0 in | Wt 116.8 lb

## 2022-07-13 DIAGNOSIS — N898 Other specified noninflammatory disorders of vagina: Secondary | ICD-10-CM | POA: Diagnosis not present

## 2022-07-13 DIAGNOSIS — N390 Urinary tract infection, site not specified: Secondary | ICD-10-CM | POA: Diagnosis not present

## 2022-07-13 DIAGNOSIS — R0981 Nasal congestion: Secondary | ICD-10-CM | POA: Diagnosis not present

## 2022-07-13 LAB — URINALYSIS, ROUTINE W REFLEX MICROSCOPIC
Bilirubin, UA: NEGATIVE
Glucose, UA: NEGATIVE
Ketones, UA: NEGATIVE
Nitrite, UA: NEGATIVE
Specific Gravity, UA: >1.03 — ABNORMAL HIGH (ref 1.005–1.030)
Urobilinogen, Ur: 0.2 mg/dL (ref 0.2–1.0)
pH, UA: 6 (ref 5.0–7.5)

## 2022-07-13 LAB — MICROSCOPIC EXAMINATION
RBC, Urine: NONE SEEN /hpf (ref 0–2)
Renal Epithel, UA: NONE SEEN /hpf

## 2022-07-13 LAB — VERITOR FLU A/B WAIVED
Influenza A: NEGATIVE
Influenza B: NEGATIVE

## 2022-07-13 MED ORDER — AMOXICILLIN 875 MG PO TABS
875.00 mg | ORAL_TABLET | Freq: Two times a day (BID) | ORAL | 0 refills | Status: AC
Start: 2022-07-13 — End: 2022-07-18

## 2022-07-13 NOTE — Progress Notes (Signed)
New Patient Office Visit  Subjective    Patient ID: Heather Barajas, female    DOB: 05-28-2000  Age: 22 y.o. MRN: [id number]  CC:  Chief Complaint  Patient presents with   Vaginal Discharge    Has been having vaginal discharge since beginning of June. Seen 06/17/22 got better with antibiotic but since stopping symptoms have returned. Vaginal discharge, possible cut, irritation when going to bathroom.     HPI Heather Barajas presents today for a f/u visit for UTI symptoms.  She was seen by PCP and treated with Amoxicillin, and reports that he symptoms came back after 1-week. She has been sexually active during the course of the treatment. Urinary symptoms Patient reports a 2 weeks history of vaginal d/c, increase urinary frequency, urgency, vaginal discharge. Denies hematuria, fevers, chills, abdominal pain, nausea, vomiting, back pain, ve or vaginal bleeding.   LMP 06/20/22, HCG negative  URI:  new Cough, congestion fever, POC flu negative. She has been taking OTC mucinex   Outpatient Encounter Medications as of 07/13/2022  Medication Sig   amoxicillin (AMOXIL) 875 MG tablet Take 1 tablet (875 mg total) by mouth 2 (two) times daily for 5 days.   norethindrone-ethinyl estradiol-FE (JUNEL FE 1/20) 1-20 MG-MCG tablet Take 1 tablet by mouth daily.   Probiotic Product (PROBIOTIC DAILY PO) Take by mouth.   No facility-administered encounter medications on file as of 07/13/2022.    History reviewed. No pertinent past medical history.  History reviewed. No pertinent surgical history.  History reviewed. No pertinent family history.  Social History   Socioeconomic History   Marital status: Single    Spouse name: Not on file   Number of children: Not on file   Years of education: Not on file   Highest education level: Not on file  Occupational History   Not on file  Tobacco Use   Smoking status: Never   Smokeless tobacco: Never  Vaping Use   Vaping Use: Never used  Substance  and Sexual Activity   Alcohol use: No    Alcohol/week: 0.0 standard drinks of alcohol   Drug use: No   Sexual activity: Yes    Birth control/protection: Pill  Other Topics Concern   Not on file  Social History Narrative   Not on file   Social Determinants of Health   Financial Resource Strain: Not on file  Food Insecurity: Not on file  Transportation Needs: Not on file  Physical Activity: Not on file  Stress: Not on file  Social Connections: Not on file  Intimate Partner Violence: Not on file    ROS Negative unless indicated in HPI   Objective    BP 100/67   Pulse 86   Temp (!) 97.5 F (36.4 C) (Temporal)   Ht 4\' 11"  (1.499 m)   Wt 116 lb 12.8 oz (53 kg)   LMP 06/17/2022 (Exact Date)   SpO2 98%   BMI 23.59 kg/m   Physical Exam Vitals and nursing note reviewed.  Constitutional:      Appearance: Normal appearance.  HENT:     Head: Normocephalic and atraumatic.  Eyes:     Extraocular Movements: Extraocular movements intact.     Pupils: Pupils are equal, round, and reactive to light.  Cardiovascular:     Rate and Rhythm: Normal rate.     Heart sounds: Normal heart sounds.  Pulmonary:     Effort: Pulmonary effort is normal.     Breath sounds: Normal breath sounds.  Abdominal:     General: Bowel sounds are normal.     Palpations: Abdomen is soft.     Tenderness: There is no abdominal tenderness.     Hernia: No hernia is present.  Skin:    General: Skin is warm and dry.  Neurological:     General: No focal deficit present.     Mental Status: She is alert and oriented to person, place, and time.  Psychiatric:        Mood and Affect: Mood normal.        Thought Content: Thought content normal.        Judgment: Judgment normal.    Appears well, in no apparent distress.  Vital signs are normal. The abdomen is soft without tenderness, guarding, mass, rebound or organomegaly. No CVA tenderness or inguinal adenopathy noted. Urine dipstick shows positive for +2  WBC's, positive for RBC's, and positive for protein.  Micro exam: +0-5 WBC's per HPF, + epithelial cell , mucus thread,  and bacteria few. Last CBC Lab Results  Component Value Date   WBC 5.4 03/03/2022   HGB 12.6 03/03/2022   HCT 38.3 03/03/2022   MCV 89 03/03/2022   MCH 29.4 03/03/2022   RDW 12.3 03/03/2022   PLT 338 03/03/2022   Last metabolic panel Lab Results  Component Value Date   GLUCOSE 84 03/03/2022   NA 143 03/03/2022   K 4.3 03/03/2022   CL 108 (H) 03/03/2022   CO2 19 (L) 03/03/2022   BUN 12 03/03/2022   CREATININE 0.76 03/03/2022   EGFR 114 03/03/2022   CALCIUM 9.3 03/03/2022   PROT 6.5 03/03/2022   ALBUMIN 4.4 03/03/2022   LABGLOB 2.1 03/03/2022   AGRATIO 2.1 03/03/2022   BILITOT 0.3 03/03/2022   ALKPHOS 32 (L) 03/03/2022   AST 13 03/03/2022   ALT 14 03/03/2022   Last lipids Lab Results  Component Value Date   CHOL 142 03/03/2022   HDL 51 03/03/2022   LDLCALC 78 03/03/2022   TRIG 66 03/03/2022   CHOLHDL 2.8 03/03/2022        Assessment & Plan:  Nasal congestion -     Veritor Flu A/B Waived  Vaginal discharge -     Urinalysis, Routine w reflex microscopic  Complicated UTI (urinary tract infection)  Other orders -     Amoxicillin; Take 1 tablet (875 mg total) by mouth 2 (two) times daily for 5 days.  Dispense: 10 tablet; Refill: 0       ASSESSMENT: UTI uncomplicated without evidence of pyelonephritis  PLAN: Give an extra 5 days of amoxicillin BID, while waiting for urine culture  - ,No intercourse until done with ATB Increase hydration - may use Pyridium OTC prn.  - URI continue OTC Mucinex Call or return to clinic prn if these symptoms worsen or fail to improve as anticipated.   Return if symptoms worsen or fail to improve, for with PCP.   Arrie Aran Santa Lighter, DNP [facility] [address] [place], [place] [address] [phone]

## 2022-07-14 NOTE — Progress Notes (Signed)
Result discussed with client during the encounter

## 2022-07-15 LAB — URINE CULTURE

## 2022-07-27 ENCOUNTER — Telehealth: Payer: Self-pay | Admitting: Family Medicine

## 2022-07-27 NOTE — Telephone Encounter (Signed)
Lmtcb.

## 2022-07-28 NOTE — Telephone Encounter (Signed)
Pt called back to let provider know that she decided to go to urgent care so she was seen and tested there and is waiting on her results.

## 2022-08-28 ENCOUNTER — Ambulatory Visit: Payer: Medicaid Other

## 2022-08-30 ENCOUNTER — Ambulatory Visit: Payer: Self-pay

## 2022-09-22 ENCOUNTER — Ambulatory Visit (INDEPENDENT_AMBULATORY_CARE_PROVIDER_SITE_OTHER): Payer: Managed Care, Other (non HMO) | Admitting: Obstetrics and Gynecology

## 2022-09-22 ENCOUNTER — Encounter: Payer: Self-pay | Admitting: Obstetrics and Gynecology

## 2022-09-22 ENCOUNTER — Other Ambulatory Visit: Payer: Self-pay | Admitting: Obstetrics and Gynecology

## 2022-09-22 VITALS — BP 121/82 | HR 79 | Ht 59.0 in | Wt 120.0 lb

## 2022-09-22 DIAGNOSIS — Z202 Contact with and (suspected) exposure to infections with a predominantly sexual mode of transmission: Secondary | ICD-10-CM | POA: Diagnosis not present

## 2022-09-22 MED ORDER — VALACYCLOVIR HCL 500 MG PO TABS: 500.0000 mg | ORAL_TABLET | Freq: Two times a day (BID) | ORAL | 3 refills | Status: DC

## 2022-09-22 MED ORDER — VALACYCLOVIR HCL 500 MG PO TABS: 500.0000 mg | ORAL_TABLET | Freq: Two times a day (BID) | ORAL | 6 refills | Status: DC

## 2022-09-22 NOTE — Progress Notes (Signed)
    GYNECOLOGY  ENCOUNTER NOTE  History:     Heather Barajas is a 22 y.o. G0P0000 female here for follow up. 1 month ago she was seen at [facility] and diagnosed with primary vaginal HSV outbreak. She reports flu like symptoms 1 day prior and a "cut" on her vaginal wall that became increasingly more painful. She had a HSV culture collected and was given an RX for valtrex and has had no further issues.  The HSV culture was positive. She wanted follow up with GYN to discuss future outbreaks.    Gynecologic History Patient's last menstrual period was 08/17/2022. Contraception: BC pills  Last Pap: 03/03/22. Result was normal with negative HPV   Obstetric History OB History  Gravida Para Term Preterm AB Living  0 0 0 0 0 0  SAB IAB Ectopic Multiple Live Births  0 0 0 0 0    Past Medical History:  Diagnosis Date   HSV infection     History reviewed. No pertinent surgical history.  Current Outpatient Medications on File Prior to Visit  Medication Sig Dispense Refill   norethindrone-ethinyl estradiol-FE (JUNEL FE 1/20) 1-20 MG-MCG tablet Take 1 tablet by mouth daily. 84 tablet 3   Probiotic Product (PROBIOTIC DAILY PO) Take by mouth.     No current facility-administered medications on file prior to visit.    No Known Allergies  Social History:  reports that she has never smoked. She has never used smokeless tobacco. She reports that she does not drink alcohol and does not use drugs.  History reviewed. No pertinent family history.  The following portions of the patient's history were reviewed and updated as appropriate: allergies, current medications, past family history, past medical history, past social history, past surgical history and problem list.  Review of Systems Pertinent items noted in HPI and remainder of comprehensive ROS otherwise negative.  Physical Exam:  BP 121/82   Pulse 79   Ht 4\' 11"  (1.499 m)   Wt 120 lb (54.4 kg)   LMP 08/17/2022   BMI 24.24 kg/m   CONSTITUTIONAL: Well-developed, well-nourished female in no acute distress.  HENT:  Normocephalic  NECK: Normal range of motion NEUROLOGIC: Alert and oriented to person, place, and time.  PSYCHIATRIC: Normal mood and affect. Normal behavior. Tearful    Assessment and Plan:    1. STD exposure  - Our office to request records from [facility]. - Rx: Valtrex for future outbreaks - Condoms always - Pap every 3 years, annual exam every year - HIV antibody (with reflex) - RPR - Hepatitis C Antibody    Routine preventative health maintenance measures emphasized. Please refer to After Visit Summary for other counseling recommendations.     Li Bobo, Harolyn Rutherford, NP [facility] [employer], [facility]

## 2022-11-03 ENCOUNTER — Ambulatory Visit: Payer: Managed Care, Other (non HMO) | Admitting: Family Medicine

## 2022-11-04 ENCOUNTER — Encounter: Payer: Self-pay | Admitting: Family Medicine

## 2022-11-18 ENCOUNTER — Ambulatory Visit: Payer: Managed Care, Other (non HMO) | Admitting: Family Medicine

## 2022-12-21 ENCOUNTER — Telehealth: Payer: Self-pay | Admitting: Family Medicine

## 2022-12-21 NOTE — Telephone Encounter (Signed)
Apt scheduled for 12/23/2022  Copied from CRM #[id number]. Topic: General - Call Back - No Documentation >> Dec 21, 2022 12:19 PM Mosetta Putt H wrote: Reason for CRM: pt is inquiring about emotional support letter

## 2022-12-23 ENCOUNTER — Ambulatory Visit: Payer: Managed Care, Other (non HMO) | Admitting: Family Medicine

## 2023-01-12 ENCOUNTER — Ambulatory Visit: Payer: Managed Care, Other (non HMO) | Admitting: Family Medicine

## 2023-01-12 ENCOUNTER — Encounter: Payer: Self-pay | Admitting: Family Medicine

## 2023-01-12 VITALS — BP 110/70 | HR 66 | Temp 98.3°F | Ht 59.0 in | Wt 121.0 lb

## 2023-01-12 DIAGNOSIS — F419 Anxiety disorder, unspecified: Secondary | ICD-10-CM | POA: Diagnosis not present

## 2023-01-12 DIAGNOSIS — Z3041 Encounter for surveillance of contraceptive pills: Secondary | ICD-10-CM

## 2023-01-12 DIAGNOSIS — F4321 Adjustment disorder with depressed mood: Secondary | ICD-10-CM | POA: Diagnosis not present

## 2023-01-12 DIAGNOSIS — Z23 Encounter for immunization: Secondary | ICD-10-CM | POA: Diagnosis not present

## 2023-01-12 MED ORDER — NORETHIN ACE-ETH ESTRAD-FE 1-20 MG-MCG PO TABS: 1.0000 | ORAL_TABLET | Freq: Every day | ORAL | 3 refills | Status: AC

## 2023-01-12 NOTE — Progress Notes (Signed)
 Subjective:  Patient ID: Heather Barajas, female    DOB: 30-Jun-2000, 23 y.o.   MRN: [id number]  Patient Care Team: Cathlene Marry Lenis, FNP as PCP - General (Family Medicine)   Chief Complaint:  ESA letter  HPI: Heather CROME Corbridge is a 23 y.o. female presenting on 01/12/2023 for ESA letter Patient presents today to discuss ESA letter for her dog. States that she has had a few life events that have been harder than she expected. 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 She decided to terminate the pregnancy. (States that this was completed at [facility] in [place] on [address], we do not have records in the chart. States that she has relied more on her dog since the decision to terminate. She took time off of work. Reports that before having her dog she had worsening symptoms of anxiety and depression. States that he helps improve those symptoms. She states that the decision to terminate was harder than I thought and that her dog has helped calm me down and brought happiness to her. She also describes that she is motivated to get out of bed and walk her dog. She does not currently see therapy, but is interested. Does not have thoughts of self harm. Has owned dog for 1-1.5 years  States that she has history of anxiety but she has always just managed it at home. 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   In addition, reports that she needs a refill of OCP. She states that she is currently having menses, she has not missed any doses since November, she remains sexually active. Denies fever, burning with urination.   Relevant past medical, surgical, family, and social history reviewed and updated as indicated.  Allergies and medications reviewed and updated. Data reviewed: Chart in Epic.   Past Medical History:  Diagnosis Date   HSV infection     No past surgical history on  file.  Social History   Socioeconomic History   Marital status: Single    Spouse name: Not on file   Number of children: Not on file   Years of education: Not on file   Highest education level: Not on file  Occupational History   Not on file  Tobacco Use   Smoking status: Never   Smokeless tobacco: Never  Vaping Use   Vaping status: Never Used  Substance and Sexual Activity   Alcohol use: No    Alcohol/week: 0.0 standard drinks of alcohol   Drug use: No   Sexual activity: Yes    Birth control/protection: Pill  Other Topics Concern   Not on file  Social History Narrative   Not on file   Social Drivers of Health   Financial Resource Strain: Not on file  Food Insecurity: Not on file  Transportation Needs: Not on file  Physical Activity: Not on file  Stress: Not on file  Social Connections: Unknown (05/19/2021)   Received from [facility], [facility]   Social Network    Social Network: Not on file  Intimate Partner Violence: Unknown (04/10/2021)   Received [facility], [facility]   HITS    Physically Hurt: Not on file    Insult or Talk Down To: Not on file    Threaten Physical Harm: Not on file    Scream or Curse: Not on file  Outpatient Encounter Medications as of 01/12/2023  Medication Sig   norethindrone-ethinyl estradiol-FE (JUNEL FE 1/20) 1-20 MG-MCG tablet Take 1 tablet by mouth daily.   Probiotic Product (PROBIOTIC DAILY PO) Take by mouth.   valACYclovir  (VALTREX ) 500 MG tablet Take 1 tablet (500 mg total) by mouth 2 (two) times daily. Take 500 mg BID x 3 days. Take this regimen for each breakout.   No facility-administered encounter medications on file as of 01/12/2023.    No Known Allergies  Review of Systems As per HPI  Objective:  BP 110/70   Pulse 66   Temp 98.3 F (36.8 C)   Ht 4' 11 (1.499 m)   Wt 121 lb (54.9 kg)   SpO2 99%   BMI 24.44 kg/m    Wt Readings from Last 3 Encounters:  01/12/23 121 lb (54.9 kg)  09/22/22 120 lb  (54.4 kg)  07/13/22 116 lb 12.8 oz (53 kg)    Physical Exam Constitutional:      General: She is awake. She is not in acute distress.    Appearance: Normal appearance. She is well-developed and well-groomed. She is not ill-appearing, toxic-appearing or diaphoretic.  Cardiovascular:     Rate and Rhythm: Normal rate and regular rhythm.     Pulses: Normal pulses.          Radial pulses are 2+ on the right side and 2+ on the left side.       Posterior tibial pulses are 2+ on the right side and 2+ on the left side.     Heart sounds: Normal heart sounds. No murmur heard.    No gallop.  Pulmonary:     Effort: Pulmonary effort is normal. No respiratory distress.     Breath sounds: Normal breath sounds. No stridor. No wheezing, rhonchi or rales.  Musculoskeletal:     Cervical back: Full passive range of motion without pain and neck supple.     Right lower leg: No edema.     Left lower leg: No edema.  Skin:    General: Skin is warm.     Capillary Refill: Capillary refill takes less than 2 seconds.  Neurological:     General: No focal deficit present.     Mental Status: She is alert, oriented to person, place, and time and easily aroused. Mental status is at baseline.     GCS: GCS eye subscore is 4. GCS verbal subscore is 5. GCS motor subscore is 6.     Motor: No weakness.  Psychiatric:        Attention and Perception: Attention and perception normal.        Mood and Affect: Mood and affect normal.        Speech: Speech normal.        Behavior: Behavior normal. Behavior is cooperative.        Thought Content: Thought content normal. Thought content does not include homicidal or suicidal ideation. Thought content does not include homicidal or suicidal plan.        Cognition and Memory: Cognition and memory normal.        Judgment: Judgment normal.     Results for orders placed or performed in visit on 07/13/22  Veritor Flu A/B Waived   Collection Time: 07/13/22 11:24 AM  Result Value  Ref Range   Influenza A Negative Negative   Influenza B Negative Negative  Microscopic Examination   Collection Time: 07/13/22 11:26 AM   Urine  Result Value Ref Range  WBC, UA 0-5 0 - 5 /hpf   RBC, Urine None seen 0 - 2 /hpf   Epithelial Cells (non renal) 0-10 0 - 10 /hpf   Renal Epithel, UA None seen None seen /hpf   Mucus, UA Present (A) Not Estab.   Bacteria, UA Few (A) None seen/Few  Urinalysis, Routine w reflex microscopic   Collection Time: 07/13/22 11:26 AM  Result Value Ref Range   Specific Gravity, UA >1.030 (H) 1.005 - 1.030   pH, UA 6.0 5.0 - 7.5   Color, UA Yellow Yellow   Appearance Ur Clear Clear   Leukocytes,UA 2+ (A) Negative   Protein,UA Trace (A) Negative/Trace   Glucose, UA Negative Negative   Ketones, UA Negative Negative   RBC, UA Trace (A) Negative   Bilirubin, UA Negative Negative   Urobilinogen, Ur 0.2 0.2 - 1.0 mg/dL   Nitrite, UA Negative Negative   Microscopic Examination See below:   Urine Culture   Collection Time: 07/13/22  1:47 PM   Specimen: Urine   UR  Result Value Ref Range   Urine Culture, Routine Final report (A)    Organism ID, Bacteria Comment (A)        01/12/2023    3:37 PM 07/13/2022   10:46 AM 06/17/2022    9:10 AM 03/03/2022    8:24 AM 05/21/2020    3:01 PM  Depression screen PHQ 2/9  Decreased Interest 1 0 0 0 0  Down, Depressed, Hopeless 0 0 0 0 0  PHQ - 2 Score 1 0 0 0 0  Altered sleeping 1 0 0 0 0  Tired, decreased energy 0 0 0 0 0  Change in appetite 0 0 0 1 0  Feeling bad or failure about yourself  0 0 0 0 0  Trouble concentrating 0 0 0 0 0  Moving slowly or fidgety/restless 0 0  0 0  Suicidal thoughts 0 0 0 0 0  PHQ-9 Score 2 0 0 1 0  Difficult doing work/chores Somewhat difficult Not difficult at all Not difficult at all Not difficult at all        07/13/2022   10:47 AM 06/17/2022    9:10 AM 03/03/2022    8:27 AM  GAD 7 : Generalized Anxiety Score  Nervous, Anxious, on Edge 0 1 0  Control/stop worrying 0 0 0   Worry too much - different things 0 0 0  Trouble relaxing 0 0 0  Restless 0 0 0  Easily annoyed or irritable 0 0 0  Afraid - awful might happen 0 0 0  Total GAD 7 Score 0 1 0  Anxiety Difficulty Not difficult at all Not difficult at all Not difficult at all   Pertinent labs & imaging results that were available during my care of the patient were reviewed by me and considered in my medical decision making.  Assessment & Plan:  Yoko was seen today for esa letter.  Diagnoses and all orders for this visit:  Anxiety Referral placed as below for patient to start counseling. Patient does not wish to start medication at this time, however she expressed interest. She is to follow up with PCP if she wishes to start medication therapy. ESA letter provided to patient. Provided information on how to register with ESA registry. Denies SI. Safety contract established.  -     Ambulatory referral to Psychology  Grief As above.  -     Ambulatory referral to Psychology  Encounter for surveillance of  contraceptive pills Refill as below. Discussed safe sex practices.  -     norethindrone-ethinyl estradiol-FE (JUNEL FE 1/20) 1-20 MG-MCG tablet; Take 1 tablet by mouth daily.  Encounter for immunization - Flu vaccine trivalent PF, 6mos and older(Flulaval,Afluria,Fluarix,Fluzone)  Continue all other maintenance medications.  Follow up plan: Return in about 3 months (around 04/12/2023) for Chronic Condition Follow up.   Continue healthy lifestyle choices, including diet (rich in fruits, vegetables, and lean proteins, and low in salt and simple carbohydrates) and exercise (at least 30 minutes of moderate physical activity daily).  Written and verbal instructions provided   The above assessment and management plan was discussed with the patient. The patient verbalized understanding of and has agreed to the management plan. Patient is aware to call the clinic if they develop any new symptoms or if  symptoms persist or worsen. Patient is aware when to return to the clinic for a follow-up visit. Patient educated on when it is appropriate to go to the emergency department.   Marry Kins, DNP-FNP [facility] [address] [place], [place] [address] [phone]

## 2023-01-12 NOTE — Patient Instructions (Signed)
 [email or web address]  ESA Registry

## 2023-01-13 ENCOUNTER — Encounter: Payer: Self-pay | Admitting: Family Medicine

## 2023-02-09 ENCOUNTER — Other Ambulatory Visit: Payer: Self-pay | Admitting: *Deleted

## 2023-02-09 MED ORDER — VALACYCLOVIR HCL 500 MG PO TABS: 500.0000 mg | ORAL_TABLET | Freq: Two times a day (BID) | ORAL | 3 refills | Status: AC

## 2023-04-13 ENCOUNTER — Encounter (HOSPITAL_COMMUNITY): Payer: Self-pay

## 2023-04-13 ENCOUNTER — Ambulatory Visit: Payer: Managed Care, Other (non HMO) | Admitting: Family Medicine

## 2023-04-13 ENCOUNTER — Ambulatory Visit (INDEPENDENT_AMBULATORY_CARE_PROVIDER_SITE_OTHER): Admitting: Clinical

## 2023-04-13 DIAGNOSIS — F419 Anxiety disorder, unspecified: Secondary | ICD-10-CM

## 2023-04-13 DIAGNOSIS — F331 Major depressive disorder, recurrent, moderate: Secondary | ICD-10-CM | POA: Diagnosis not present
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
## 2023-04-16 ENCOUNTER — Ambulatory Visit (INDEPENDENT_AMBULATORY_CARE_PROVIDER_SITE_OTHER): Admitting: Family Medicine

## 2023-04-16 ENCOUNTER — Encounter: Payer: Self-pay | Admitting: Family Medicine

## 2023-04-16 VITALS — BP 110/68 | HR 63 | Ht 59.0 in | Wt 121.0 lb

## 2023-04-16 DIAGNOSIS — F4321 Adjustment disorder with depressed mood: Secondary | ICD-10-CM | POA: Diagnosis not present

## 2023-04-16 DIAGNOSIS — F419 Anxiety disorder, unspecified: Secondary | ICD-10-CM | POA: Diagnosis not present

## 2023-04-16 MED ORDER — BUSPIRONE HCL 5 MG PO TABS
5.0000 mg | ORAL_TABLET | Freq: Three times a day (TID) | ORAL | 0 refills | Status: DC
Start: 2023-04-16 — End: 2023-05-10

## 2023-04-16 NOTE — Progress Notes (Signed)
 Subjective:  Patient ID: Heather Barajas, female    DOB: 2000-02-23, 23 y.o.   MRN: [id number]  Patient Care Team: Ellamae Sia Aleen Campi, FNP as PCP - General (Family Medicine)   Chief Complaint:  Medical Management of Chronic Issues (3 month follow up)   HPI: Heather Barajas is a 23 y.o. female presenting on 04/16/2023 for Medical Management of Chronic Issues (3 month follow up)  HPI 1. Anxiety States that she had therapy appt on Tuesday and it went well. Not currently taking medication. She is interested in something as needed . States that sleep is going well. She does get anxious from time to time. Denies thoughts of self harm. She states that she would like to try something as needed for her anxiety, but she does not want anything habit-formin as her biological parents struggled with substance abuse.   Relevant past medical, surgical, family, and social history reviewed and updated as indicated.  Allergies and medications reviewed and updated. Data reviewed: Chart in Epic.   Past Medical History:  Diagnosis Date   HSV infection     History reviewed. No pertinent surgical history.  Social History   Socioeconomic History   Marital status: Single    Spouse name: Not on file   Number of children: Not on file   Years of education: Not on file   Highest education level: Bachelor's degree (e.g., BA, AB, BS)  Occupational History   Not on file  Tobacco Use   Smoking status: Never   Smokeless tobacco: Never  Vaping Use   Vaping status: Never Used  Substance and Sexual Activity   Alcohol use: No    Alcohol/week: 0.0 standard drinks of alcohol   Drug use: No   Sexual activity: Yes    Birth control/protection: Pill  Other Topics Concern   Not on file  Social History Narrative   Not on file   Social Drivers of Health   Financial Resource Strain: Low Risk  (04/15/2023)   Overall Financial Resource Strain (CARDIA)    Difficulty of Paying Living Expenses: Not very  hard  Food Insecurity: No Food Insecurity (04/15/2023)   Hunger Vital Sign    Worried About Running Out of Food in the Last Year: Never true    Ran Out of Food in the Last Year: Never true  Transportation Needs: No Transportation Needs (04/15/2023)   PRAPARE - Administrator, Civil Service (Medical): No    Lack of Transportation (Non-Medical): No  Physical Activity: Unknown (04/15/2023)   Exercise Vital Sign    Days of Exercise per Week: 0 days    Minutes of Exercise per Session: Not on file  Stress: No Stress Concern Present (04/15/2023)   [employer] of Occupational Health - Occupational Stress Questionnaire    Feeling of Stress : Only a little  Social Connections: Moderately Integrated (04/15/2023)   Social Connection and Isolation Panel [NHANES]    Frequency of Communication with Friends and Family: More than three times a week    Frequency of Social Gatherings with Friends and Family: Twice a week    Attends Religious Services: More than 4 times per year    Active Member of [employer] or Organizations: Yes    Attends Banker Meetings: More than 4 times per year    Marital Status: Never married  Intimate Partner Violence: Not At Risk (04/16/2023)   Humiliation, Afraid, Rape, and Kick questionnaire    Fear of Current or  Ex-Partner: No    Emotionally Abused: No    Physically Abused: No    Sexually Abused: No    Outpatient Encounter Medications as of 04/16/2023  Medication Sig   norethindrone-ethinyl estradiol-FE (JUNEL FE 1/20) 1-20 MG-MCG tablet Take 1 tablet by mouth daily.   Probiotic Product (PROBIOTIC DAILY PO) Take by mouth.   valACYclovir (VALTREX) 500 MG tablet Take 1 tablet (500 mg total) by mouth 2 (two) times daily. Take 500 mg BID x 3 days. Take this regimen for each breakout.   No facility-administered encounter medications on file as of 04/16/2023.    No Known Allergies  Review of Systems As per HPI  Objective:  BP 110/68   Pulse 63    Ht 4\' 11"  (1.499 m)   Wt 121 lb (54.9 kg)   LMP 04/12/2023 (Exact Date)   SpO2 99%   BMI 24.44 kg/m    Wt Readings from Last 3 Encounters:  04/16/23 121 lb (54.9 kg)  01/12/23 121 lb (54.9 kg)  09/22/22 120 lb (54.4 kg)   Physical Exam Constitutional:      General: She is awake. She is not in acute distress.    Appearance: Normal appearance. She is well-developed and well-groomed. She is not ill-appearing, toxic-appearing or diaphoretic.  Cardiovascular:     Rate and Rhythm: Normal rate and regular rhythm.     Pulses: Normal pulses.          Radial pulses are 2+ on the right side and 2+ on the left side.       Posterior tibial pulses are 2+ on the right side and 2+ on the left side.     Heart sounds: Normal heart sounds. No murmur heard.    No gallop.  Pulmonary:     Effort: Pulmonary effort is normal. No respiratory distress.     Breath sounds: Normal breath sounds. No stridor. No wheezing, rhonchi or rales.  Musculoskeletal:     Cervical back: Full passive range of motion without pain and neck supple.     Right lower leg: No edema.     Left lower leg: No edema.  Skin:    General: Skin is warm.     Capillary Refill: Capillary refill takes less than 2 seconds.  Neurological:     General: No focal deficit present.     Mental Status: She is alert, oriented to person, place, and time and easily aroused. Mental status is at baseline.     GCS: GCS eye subscore is 4. GCS verbal subscore is 5. GCS motor subscore is 6.     Motor: No weakness.  Psychiatric:        Attention and Perception: Attention and perception normal.        Mood and Affect: Mood and affect normal.        Speech: Speech normal.        Behavior: Behavior normal. Behavior is cooperative.        Thought Content: Thought content normal. Thought content does not include homicidal or suicidal ideation. Thought content does not include homicidal or suicidal plan.        Cognition and Memory: Cognition and memory  normal.        Judgment: Judgment normal.      Results for orders placed or performed in visit on 07/13/22  Veritor Flu A/B Waived   Collection Time: 07/13/22 11:24 AM  Result Value Ref Range   Influenza A Negative Negative   Influenza B Negative  Negative  Microscopic Examination   Collection Time: 07/13/22 11:26 AM   Urine  Result Value Ref Range   WBC, UA 0-5 0 - 5 /hpf   RBC, Urine None seen 0 - 2 /hpf   Epithelial Cells (non renal) 0-10 0 - 10 /hpf   Renal Epithel, UA None seen None seen /hpf   Mucus, UA Present (A) Not Estab.   Bacteria, UA Few (A) None seen/Few  Urinalysis, Routine w reflex microscopic   Collection Time: 07/13/22 11:26 AM  Result Value Ref Range   Specific Gravity, UA >1.030 (H) 1.005 - 1.030   pH, UA 6.0 5.0 - 7.5   Color, UA Yellow Yellow   Appearance Ur Clear Clear   Leukocytes,UA 2+ (A) Negative   Protein,UA Trace (A) Negative/Trace   Glucose, UA Negative Negative   Ketones, UA Negative Negative   RBC, UA Trace (A) Negative   Bilirubin, UA Negative Negative   Urobilinogen, Ur 0.2 0.2 - 1.0 mg/dL   Nitrite, UA Negative Negative   Microscopic Examination See below:   Urine Culture   Collection Time: 07/13/22  1:47 PM   Specimen: Urine   UR  Result Value Ref Range   Urine Culture, Routine Final report (A)    Organism ID, Bacteria Comment (A)        04/16/2023   10:01 AM 04/13/2023    3:32 PM 01/12/2023    3:37 PM 07/13/2022   10:46 AM 06/17/2022    9:10 AM  Depression screen PHQ 2/9  Decreased Interest 1  1 0 0  Down, Depressed, Hopeless 0  0 0 0  PHQ - 2 Score 1  1 0 0  Altered sleeping 1  1 0 0  Tired, decreased energy 0  0 0 0  Change in appetite 0  0 0 0  Feeling bad or failure about yourself  0  0 0 0  Trouble concentrating 0  0 0 0  Moving slowly or fidgety/restless 0  0 0   Suicidal thoughts 0  0 0 0  PHQ-9 Score 2  2 0 0  Difficult doing work/chores Somewhat difficult  Somewhat difficult Not difficult at all Not difficult at  all     Information is confidential and restricted. Go to Review Flowsheets to unlock data.       04/16/2023   10:01 AM 04/13/2023    3:32 PM 01/12/2023    3:37 PM 07/13/2022   10:47 AM  GAD 7 : Generalized Anxiety Score  Nervous, Anxious, on Edge 1  1 0  Control/stop worrying 0  1 0  Worry too much - different things 0  0 0  Trouble relaxing 0  0 0  Restless 0  0 0  Easily annoyed or irritable 0  0 0  Afraid - awful might happen 0  0 0  Total GAD 7 Score 1  2 0  Anxiety Difficulty Somewhat difficult  Somewhat difficult Not difficult at all     Information is confidential and restricted. Go to Review Flowsheets to unlock data.   Pertinent labs & imaging results that were available during my care of the patient were reviewed by me and considered in my medical decision making.  Assessment & Plan:  Eleen was seen today for medical management of chronic issues.  Diagnoses and all orders for this visit:  Anxiety Will start medication as below for anxiety. Discussed side effects. Patient to continue counseling. Denies SI. Safety contract established.  -  busPIRone (BUSPAR) 5 MG tablet; Take 1 tablet (5 mg total) by mouth 3 (three) times daily.  Grief As above.     Continue all other maintenance medications.  Follow up plan: Return in about 2 months (around 06/16/2023) for anxiety follow up .   Continue healthy lifestyle choices, including diet (rich in fruits, vegetables, and lean proteins, and low in salt and simple carbohydrates) and exercise (at least 30 minutes of moderate physical activity daily).  Written and verbal instructions provided   The above assessment and management plan was discussed with the patient. The patient verbalized understanding of and has agreed to the management plan. Patient is aware to call the clinic if they develop any new symptoms or if symptoms persist or worsen. Patient is aware when to return to the clinic for a follow-up visit. Patient  educated on when it is appropriate to go to the emergency department.   Neale Burly, DNP-FNP [facility] [address] [place], [place] [address] [phone]

## 2023-04-20 ENCOUNTER — Ambulatory Visit: Admitting: Family Medicine

## 2023-05-08 ENCOUNTER — Other Ambulatory Visit: Payer: Self-pay | Admitting: Family Medicine

## 2023-05-08 DIAGNOSIS — F419 Anxiety disorder, unspecified: Secondary | ICD-10-CM

## 2023-05-18 ENCOUNTER — Encounter: Payer: Self-pay | Admitting: Nurse Practitioner

## 2023-05-18 ENCOUNTER — Ambulatory Visit (INDEPENDENT_AMBULATORY_CARE_PROVIDER_SITE_OTHER): Admitting: Clinical

## 2023-05-18 ENCOUNTER — Ambulatory Visit (INDEPENDENT_AMBULATORY_CARE_PROVIDER_SITE_OTHER): Admitting: Nurse Practitioner

## 2023-05-18 VITALS — BP 119/75 | HR 64 | Temp 98.1°F | Ht 59.0 in | Wt 118.0 lb

## 2023-05-18 DIAGNOSIS — Z113 Encounter for screening for infections with a predominantly sexual mode of transmission: Secondary | ICD-10-CM | POA: Diagnosis not present

## 2023-05-18 DIAGNOSIS — A6009 Herpesviral infection of other urogenital tract: Secondary | ICD-10-CM | POA: Insufficient documentation

## 2023-05-18 DIAGNOSIS — F331 Major depressive disorder, recurrent, moderate: Secondary | ICD-10-CM | POA: Diagnosis not present

## 2023-05-18 DIAGNOSIS — F419 Anxiety disorder, unspecified: Secondary | ICD-10-CM

## 2023-05-18 NOTE — Progress Notes (Signed)
 Acute Office Visit  Subjective:     Patient ID: Heather Barajas, female    DOB: 2000/03/02, 23 y.o.   MRN: [id number]  Chief Complaint  Patient presents with   std screening    Positive for hsv1 last year was told to come back for repeat blood work but did not come back    HPI Heather Barajas is a 23 year old female who presents on May 06, 2023, with acute concerns and requests STD screening. 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 She has been taking Valacyclovir  (Valtrex ) for management of herpes simplex virus. The patient is currently sexually active and is using oral contraceptives for birth control. Her last menstrual period (LMP) was on May 13, 2023.  Relevant past medical, surgical, family, and social history reviewed and updated as indicated.   Allergies and medications reviewed and updated. Data reviewed: Chart in Epic.  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  Respiratory:  Negative for cough and shortness of breath.   Cardiovascular:  Negative for chest pain and leg swelling.  Gastrointestinal:  Negative for constipation, diarrhea, nausea and vomiting.  Skin:  Negative for itching and rash.  Neurological:  Negative for dizziness and headaches.   Negative unless indicated in HPI    Objective:    BP 119/75   Pulse 64   Temp 98.1 F (36.7 C) (Temporal)    Ht 4\' 11"  (1.499 m)   Wt 118 lb (53.5 kg)   SpO2 97%   BMI 23.83 kg/m  BP Readings from Last 3 Encounters:  05/18/23 119/75  04/16/23 110/68  01/12/23 110/70   Wt Readings from Last 3 Encounters:  05/18/23 118 lb (53.5 kg)  04/16/23 121 lb (54.9 kg)  01/12/23 121 lb (54.9 kg)      Physical Exam Vitals and nursing note reviewed.  Constitutional:      General: She is not in acute distress. HENT:     Head: Normocephalic and atraumatic.     Nose: Nose normal.  Eyes:     Extraocular Movements: Extraocular movements intact.     Conjunctiva/sclera: Conjunctivae normal.     Pupils: Pupils are equal, round, and reactive to light.  Cardiovascular:     Heart sounds: Normal heart sounds.  Pulmonary:     Effort: Pulmonary effort is normal.     Breath sounds: Normal breath sounds.  Musculoskeletal:        General: Normal range of motion.     Right lower leg: No edema.     Left lower leg: No edema.  Skin:    General: Skin is warm and dry.     Findings: No rash.  Neurological:     Mental Status: She is alert and oriented to person, place, and time.  Psychiatric:        Mood and Affect: Mood normal.  Behavior: Behavior normal.        Thought Content: Thought content normal.        Judgment: Judgment normal.     No results found for any visits on 05/18/23.      Assessment & Plan:  Screening examination for STD (sexually transmitted disease) -     RPR -     HIV Antibody (routine testing w rflx) -     Ct Ng M genitalium NAA, Urine -     Hepatitis C antibody  Herpes genitalis in women -     RPR -     HIV Antibody (routine testing w rflx) -     Ct Ng M genitalium NAA, Urine -     Hepatitis C antibody   Heather Barajas is a 23 year old female seen today for STDs screening, no acute distress Herpes: Continue valacyclovir  as previously prescribed no refill needed Labs: HIV, hep C, gonorrhea chlamydia Education  Herpes Simplex Virus (HSV) Education Suppressive therapy:  Continue taking Valacyclovir  (Valtrex ) as prescribed to reduce the frequency of outbreaks and lower the risk of transmission to partners.  -Transmission: HSV-1 and HSV-2 can be spread even when no sores are present. Use of condoms reduces, but does not eliminate, the risk of transmission.  -Outbreak Triggers: Stress, illness, hormonal changes, or a weakened immune system can trigger outbreaks.  -Partner communication: Important to inform all sexual partners of your HSV status.  2. Safe Sex Practices -Continue regular STD screenings, especially if you have new or multiple partners.  -Use barrier protection (e.g., condoms, dental dams) with all sexual encounters to reduce the risk of transmitting or contracting other STIs.  -Avoid sexual contact during HSV outbreaks to minimize transmission risk.  3. Contraceptive Education Oral contraceptives prevent pregnancy but do not protect against STDs.  Take your oral contraceptive at the same time daily to maintain effectiveness.  Consider dual protection (e.g., oral contraceptive + condoms) for added STD prevention and pregnancy protection.  Report any new symptoms such as lesions, unusual discharge, pain, or fever to your healthcare provider promptly   The above assessment and management plan was discussed with the patient. The patient verbalized understanding of and has agreed to the management plan. Patient is aware to call the clinic if they develop any new symptoms or if symptoms persist or worsen. Patient is aware when to return to the clinic for a follow-up visit. Patient educated on when it is appropriate to go to the emergency department.  Return if symptoms worsen or fail to improve.  Essense Bousquet [place] Thompson, DNP [facility] [address] [place], [place] [address] [phone]  Note: This document was prepared by [employer] voice dictation technology and any errors that results from this process are  unintentional.

## 2023-05-18 NOTE — Progress Notes (Signed)
 Virtual Visit via Video Note  I connected with Heather Barajas on 05/18/23 at  3:00 PM EDT by a video enabled telemedicine application and verified that I am speaking with the correct person using two identifiers.  Location: Patient: home Provider: office   I discussed the limitations of evaluation and management by telemedicine and the availability of in person appointments. The patient expressed understanding and agreed to proceed.  THERAPIST PROGRESS NOTE     Session Time: 3:00 PM- 3:30 PM   Participation Level: Active   Behavioral Response: Casual and Alert,Depressed   Type of Therapy: Individual Therapy   Treatment Goals addressed: Depression and Anxiety   Interventions: CBT   Summary: Heather Barajas a 23 y.o. female who presents with  Recurrent MDD with Anxiety. The OPT therapist worked with the patient for her OPT treatment. The OPT therapist utilized Motivational Interviewing to assist in creating therapeutic repore. The patient in the session was engaged and work in collaboration giving feedback about her triggers and symptoms over the past few weeks. The patient spoke about the recent Mothers Day being difficult as she was recently pregnant and ultimately decided to not have the child. The patient spoke about plans to go to the [facility] for [employer] weekend. The patient spoke about recently going out with a friend to a local bar and this going well.  The patient spoke about getting outside more during the Spring. The patient spoke about Journaling and feeling this is helpful.The patient spoke about interactions and her support system over the past few weeks through May. The patient spoke  about her experiences interacting with others. The OPT therapist utilized Cognitive Behavioral Therapy through cognitive restructuring as well as worked with the patient on coping strategies to assist in management of  mood and anxiety. Additionally the OPT therapist worked with the  patient on mood management , awareness, postive coping, and projection. The patient spoke about leaning into her interest of Kayaking at [facility]. The OPT therapist reviewed as well with the patient basic need areas examining the patients current eating habits, sleep schedule, exercise, and hygiene.   Suicidal/Homicidal: Nowithout intent/plan   Therapist Response:The OPT therapist worked with the patient for the patients scheduled session. The patient was engaged in her session and gave feedback in relation to triggers, symptoms, and behavior responses over the past few weeks. The OPT therapist worked with the patient utilizing an in session Cognitive Behavioral Therapy exercise. The patient was responsive in the session and verbalized, " I am looking forward to the upcoming weekend [place] to the [facility]". The OPT therapist continued work with the patient on setting boundaries with her parents. The OPT therapist worked with the patient on managing her automatic negative thoughts.The patient spoke about willingness to be aware of automatic negative thoughts and to challenge them and worked completing a in session exercise around identifying, challenging , and filtering out negative not fact based thoughts about herself.The OPT therapist worked with the patient providing ongoing psycho-education. The OPT therapist worked with the patient evaluating her use of coping to balance her external stressors. The OPT therapist worked with the patient on emotion/reactive behavior control. The patient spoke about her realization that around June 9th would be the proposed delivery date is she would have moved forward with her pregnancy and she expects this date could be triggering . The OPT therapist strategically set a follow up close to this date to add support for the patient. The OPT therapist  did overview with the patient coping skills and protective factors to lean into to manage her MH symptoms . The patient wlll  continue to utilize her med management to assist in management of her MH symptoms The OPT therapist will work with the patient in her next scheduled session.     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   Patient/Guardian was advised Release of Information must be obtained prior to any record release in order to collaborate their care with an outside provider. Patient/Guardian was advised if they have not already done so to contact the registration department to sign all necessary forms in order for us  to release information regarding their care.    Consent: Patient/Guardian gives verbal consent for treatment and assignment of benefits for services provided during this visit. Patient/Guardian expressed understanding and agreed to proceed.    I discussed the assessment and treatment plan with the patient. The patient was provided an opportunity to ask questions and all were answered. The patient agreed with the plan and demonstrated an understanding of the instructions.   The patient was advised to call back or seek an in-person evaluation if the symptoms worsen or if the condition fails to improve as anticipated.   I provided 30 minutes of non-face-to-face time during this encounter.   Heather Primmer, LCSW   05/18/2023

## 2023-05-19 LAB — CT NG M GENITALIUM NAA, URINE
Chlamydia trachomatis, NAA: NEGATIVE
Mycoplasma genitalium NAA: NEGATIVE
Neisseria gonorrhoeae, NAA: NEGATIVE

## 2023-05-19 LAB — HEPATITIS C ANTIBODY: Hep C Virus Ab: NONREACTIVE

## 2023-05-19 LAB — HIV ANTIBODY (ROUTINE TESTING W REFLEX): HIV Screen 4th Generation wRfx: NONREACTIVE

## 2023-05-19 LAB — RPR: RPR Ser Ql: NONREACTIVE

## 2023-05-20 ENCOUNTER — Telehealth: Payer: Self-pay

## 2023-05-20 ENCOUNTER — Ambulatory Visit: Payer: Self-pay | Admitting: Nurse Practitioner

## 2023-05-20 NOTE — Telephone Encounter (Signed)
 Copied from CRM [id number]. Topic: Clinical - Lab/Test Results >> May 20, 2023  1:02 PM [place] wrote: Reason for CRM: patient returned call about test results, read verbatim, no further questions

## 2023-06-15 ENCOUNTER — Ambulatory Visit (HOSPITAL_COMMUNITY): Admitting: Clinical

## 2023-06-17 ENCOUNTER — Encounter: Payer: Self-pay | Admitting: Family Medicine

## 2023-06-17 ENCOUNTER — Ambulatory Visit: Admitting: Family Medicine
